# Patient Record
Sex: Male | Born: 1980 | Race: White | Hispanic: No | Smoking: Current every day smoker
Health system: Southern US, Community
[De-identification: ages and names within clinical notes are randomized; demographics above are authoritative.]

## PROBLEM LIST (undated history)

## (undated) DIAGNOSIS — K219 Gastro-esophageal reflux disease without esophagitis: Secondary | ICD-10-CM

## (undated) DIAGNOSIS — M726 Necrotizing fasciitis: Secondary | ICD-10-CM

## (undated) DIAGNOSIS — E119 Type 2 diabetes mellitus without complications: Secondary | ICD-10-CM

## (undated) DIAGNOSIS — I639 Cerebral infarction, unspecified: Secondary | ICD-10-CM

## (undated) DIAGNOSIS — E782 Mixed hyperlipidemia: Secondary | ICD-10-CM

## (undated) HISTORY — PX: ABCESS DRAINAGE: SHX399

## (undated) HISTORY — PX: NO PAST SURGERIES: SHX2092

---

## 2005-11-18 ENCOUNTER — Emergency Department: Payer: Self-pay | Admitting: Emergency Medicine

## 2007-01-11 ENCOUNTER — Emergency Department: Payer: Self-pay

## 2007-01-11 ENCOUNTER — Other Ambulatory Visit: Payer: Self-pay

## 2011-07-06 ENCOUNTER — Emergency Department: Payer: Self-pay | Admitting: Unknown Physician Specialty

## 2012-04-20 ENCOUNTER — Observation Stay: Payer: Self-pay | Admitting: Internal Medicine

## 2012-04-20 LAB — CBC
HGB: 16.2 g/dL (ref 13.0–18.0)
MCHC: 33.6 g/dL (ref 32.0–36.0)
Platelet: 255 10*3/uL (ref 150–440)
RBC: 5.42 10*6/uL (ref 4.40–5.90)
RDW: 12.9 % (ref 11.5–14.5)

## 2012-04-20 LAB — APTT: Activated PTT: 30.6 secs (ref 23.6–35.9)

## 2012-04-20 LAB — DRUG SCREEN, URINE
Amphetamines, Ur Screen: NEGATIVE (ref ?–1000)
Barbiturates, Ur Screen: NEGATIVE (ref ?–200)
Cannabinoid 50 Ng, Ur ~~LOC~~: NEGATIVE (ref ?–50)
Cocaine Metabolite,Ur ~~LOC~~: NEGATIVE (ref ?–300)
MDMA (Ecstasy)Ur Screen: NEGATIVE (ref ?–500)
Tricyclic, Ur Screen: NEGATIVE (ref ?–1000)

## 2012-04-20 LAB — URINALYSIS, COMPLETE
Bilirubin,UR: NEGATIVE
Ketone: NEGATIVE
Leukocyte Esterase: NEGATIVE
Nitrite: NEGATIVE
Ph: 5 (ref 4.5–8.0)
Protein: NEGATIVE
RBC,UR: <1 /HPF (ref 0–5)
Squamous Epithelial: NONE SEEN
WBC UR: 1 /HPF (ref 0–5)

## 2012-04-20 LAB — PROTIME-INR: INR: 0.9

## 2012-04-20 LAB — COMPREHENSIVE METABOLIC PANEL
Albumin: 3.9 g/dL (ref 3.4–5.0)
Alkaline Phosphatase: 124 U/L (ref 50–136)
BUN: 11 mg/dL (ref 7–18)
Calcium, Total: 8.3 mg/dL — ABNORMAL LOW (ref 8.5–10.1)
Co2: 23 mmol/L (ref 21–32)
Glucose: 128 mg/dL — ABNORMAL HIGH (ref 65–99)
Osmolality: 282 (ref 275–301)
SGOT(AST): 56 U/L — ABNORMAL HIGH (ref 15–37)
Sodium: 141 mmol/L (ref 136–145)
Total Protein: 7.8 g/dL (ref 6.4–8.2)

## 2012-04-20 LAB — CK TOTAL AND CKMB (NOT AT ARMC)
CK, Total: 559 U/L — ABNORMAL HIGH (ref 35–232)
CK-MB: 0.7 ng/mL (ref 0.5–3.6)

## 2012-04-20 LAB — TROPONIN I: Troponin-I: <0.02 ng/mL

## 2012-04-20 LAB — MAGNESIUM: Magnesium: 1.9 mg/dL

## 2012-04-20 LAB — TSH: Thyroid Stimulating Horm: 2.74 u[IU]/mL

## 2012-04-20 LAB — PRO B NATRIURETIC PEPTIDE: B-Type Natriuretic Peptide: 13 pg/mL (ref 0–125)

## 2012-04-21 LAB — BASIC METABOLIC PANEL
Anion Gap: 13 (ref 7–16)
Chloride: 109 mmol/L — ABNORMAL HIGH (ref 98–107)
Co2: 21 mmol/L (ref 21–32)
Creatinine: 1.13 mg/dL (ref 0.60–1.30)
Osmolality: 285 (ref 275–301)
Sodium: 143 mmol/L (ref 136–145)

## 2012-04-21 LAB — CBC WITH DIFFERENTIAL/PLATELET
Basophil %: 1.1 %
Eosinophil #: 0.5 10*3/uL (ref 0.0–0.7)
Eosinophil %: 6.3 %
HCT: 45.8 % (ref 40.0–52.0)
HGB: 15.4 g/dL (ref 13.0–18.0)
Lymphocyte %: 31.5 %
MCH: 29.9 pg (ref 26.0–34.0)
MCHC: 33.6 g/dL (ref 32.0–36.0)
Monocyte %: 8.1 %
Neutrophil #: 4.6 10*3/uL (ref 1.4–6.5)

## 2012-04-21 LAB — LIPID PANEL: Triglycerides: 574 mg/dL — ABNORMAL HIGH (ref 0–200)

## 2012-04-21 LAB — ETHANOL: Ethanol: <3 mg/dL

## 2012-04-21 LAB — CK TOTAL AND CKMB (NOT AT ARMC): CK, Total: 363 U/L — ABNORMAL HIGH (ref 35–232)

## 2013-03-06 ENCOUNTER — Emergency Department: Payer: Self-pay | Admitting: Emergency Medicine

## 2013-03-06 LAB — CBC
HCT: 47.8 % (ref 40.0–52.0)
MCHC: 34.1 g/dL (ref 32.0–36.0)
MCV: 88 fL (ref 80–100)
Platelet: 292 10*3/uL (ref 150–440)
RDW: 13 % (ref 11.5–14.5)
WBC: 14.8 10*3/uL — ABNORMAL HIGH (ref 3.8–10.6)

## 2013-03-06 LAB — LIPASE, BLOOD: Lipase: 93 U/L (ref 73–393)

## 2013-03-07 LAB — COMPREHENSIVE METABOLIC PANEL
Albumin: 4.1 g/dL (ref 3.4–5.0)
Alkaline Phosphatase: 128 U/L (ref 50–136)
Anion Gap: 11 (ref 7–16)
Bilirubin,Total: 0.5 mg/dL (ref 0.2–1.0)
Chloride: 106 mmol/L (ref 98–107)
Co2: 21 mmol/L (ref 21–32)
EGFR (African American): >60
Osmolality: 278 (ref 275–301)
SGOT(AST): 41 U/L — ABNORMAL HIGH (ref 15–37)

## 2013-03-07 LAB — TROPONIN I: Troponin-I: <0.02 ng/mL

## 2013-03-07 LAB — URINALYSIS, COMPLETE
Ketone: NEGATIVE
Leukocyte Esterase: NEGATIVE
Nitrite: NEGATIVE
Ph: 6 (ref 4.5–8.0)
Squamous Epithelial: NONE SEEN
WBC UR: 1 /HPF (ref 0–5)

## 2013-03-07 LAB — CK TOTAL AND CKMB (NOT AT ARMC)
CK, Total: 229 U/L (ref 35–232)
CK-MB: 1.1 ng/mL (ref 0.5–3.6)
CK-MB: 0.9 ng/mL (ref 0.5–3.6)

## 2013-06-27 LAB — CBC
MCHC: 35.4 g/dL (ref 32.0–36.0)
Platelet: 257 10*3/uL (ref 150–440)
RBC: 5.15 10*6/uL (ref 4.40–5.90)
RDW: 12.8 % (ref 11.5–14.5)
WBC: 9.9 10*3/uL (ref 3.8–10.6)

## 2013-06-27 LAB — BASIC METABOLIC PANEL
BUN: 15 mg/dL (ref 7–18)
Calcium, Total: 8.5 mg/dL (ref 8.5–10.1)
Co2: 24 mmol/L (ref 21–32)
EGFR (African American): >60
Osmolality: 279 (ref 275–301)
Potassium: 3.9 mmol/L (ref 3.5–5.1)
Sodium: 138 mmol/L (ref 136–145)

## 2013-06-28 ENCOUNTER — Observation Stay: Payer: Self-pay | Admitting: Internal Medicine

## 2013-06-28 LAB — HEMOGLOBIN A1C: Hemoglobin A1C: 6.1 % (ref 4.2–6.3)

## 2013-06-28 LAB — TROPONIN I: Troponin-I: <0.02 ng/mL

## 2013-06-28 LAB — CK TOTAL AND CKMB (NOT AT ARMC)
CK, Total: 208 U/L (ref 35–232)
CK-MB: 1.2 ng/mL (ref 0.5–3.6)

## 2014-06-12 ENCOUNTER — Emergency Department: Payer: Self-pay | Admitting: Emergency Medicine

## 2014-10-01 ENCOUNTER — Ambulatory Visit: Payer: Self-pay

## 2014-10-01 LAB — DOT URINE DIP
Blood: NEGATIVE
Glucose,UR: NEGATIVE
Protein: NEGATIVE
Specific Gravity: 1.02 (ref 1.000–1.030)
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
## 2015-02-03 NOTE — Discharge Summary (Signed)
PATIENT NAME:  Jeffery Barnes, Valerio MR#:  [id number] DATE OF BIRTH:  January 25, 1981  DATE OF ADMISSION:  06/28/2013 DATE OF DISCHARGE:  06/28/2013  ADMITTING DIAGNOSIS: Chest pain.   DISCHARGE DIAGNOSES:  1.  Chest pain felt to be atypical in nature, seen by cardiology. Negative cardiac enzymes.  2.  Questionable history of coronary artery disease. The patient with cardiac [facility]. He was told he had no blockage, but states that he had some sort of cardiac abnormality.  3.  Abnormal echocardiogram with decrease in ejection fraction and wall motion abnormality. He needs outpatient followup with cardiology.  4.  Gastroesophageal reflux disease.  5.  History of sternal fractures in the past.  6.  Morbid obesity.  7.  Possible sleep apnea. Needs outpatient follow with primary care M.D.  8.  Hyperglycemia with a hemoglobin A1c being 6.1 representing glucose intolerance. He needs diet and exercise.   CONSULTANT: Dr. Gwen PoundsKowalski.   EVALUATIONS: Sodium was 138, potassium 3.9, chloride 107, bicarbonate 24, BUN 15, creatinine 0.99, glucose 150. Troponin less than 0.02. WBC 9.9, hemoglobin 16, platelet count 257. EKG: Normal sinus rhythm, Q waves in inferior leads indicating prior MI. Subsequent troponin remained negative. Hemoglobin A1c 6.1. Echocardiogram of the heart showed ejection fraction of 35% to 40%, mild to moderately decreased global left ventricular systolic function, mild and apical inferior septal in mid and apical inferior wall and basal and mild anterolateral walls are abnormal, mild to moderate mitral valve regurg, mild to moderate tricuspid regurg, mild increase in left ventricular posterior wall.   HOSPITAL COURSE: Please refer to H and P done by the admitting physician. The patient is a 34 year old white male with history of apparently some sort of cardiac abnormality noted on cath. According to him, there was no obstruction. [facility]. He presented with chest pain. He described it as worse with certain movements and it was reproducible in nature. The patient had presented with similar type of presentation in 2013. At that time, he had a negative Myoview. Due to his symptoms and this questionable coronary artery disease, he was admitted to the hospital. Further evaluation with serial cardiac enzymes were done. A cardiology consult was obtained. He had an echocardiogram, which was abnormal and it is unclear if this is a new finding. He needs further outpatient cardiac followup. He needs outpatient sleep study as well. At this time, he is doing okay. He has been cleared by cardiology for discharge.   DISCHARGE MEDICATIONS: Prilosec 20 mg 3 caps daily,  acetaminophen/oxycodone 325/5 mg, 1 tab p.o. q.6 hour p.r.n. and ibuprofen 400 q.6 hours p.r.n.   DIET: Low sodium.   ACTIVITY: As tolerated.   DISCHARGE FOLLOWUP: Follow with Dr. Gwen PoundsKowalski in 1 to 2 weeks.   NOTE: 32 minutes spent on this discharge. ____________________________ Lacie ScottsShreyang H. Allena KatzPatel, MD shp:aw D: 06/29/2013 10:34:49 ET T: 06/29/2013 10:43:43 ET JOB#: [id number]  cc: Gigi Onstad H. Allena KatzPatel, MD, <Dictator> Charise CarwinSHREYANG H Emanuel Dowson MD ELECTRONICALLY SIGNED 07/02/2013 13:01
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
## 2015-02-05 NOTE — Discharge Summary (Signed)
PATIENT NAME:  Jeffery Barnes, Jeffery Barnes MR#:  [id number] DATE OF BIRTH:  01/07/1981  DATE OF ADMISSION:  04/20/2012 DATE OF DISCHARGE:  04/22/2012  ADMISSION DIAGNOSIS: Chest pain.   DISCHARGE DIAGNOSES:   1. Chest pain, presumably musculoskeletal.  2. New onset diabetes.   3. Obesity.  4. Hypertriglyceridemia.   CONSULTS: None.   LABORATORY, DIAGNOSTIC, AND RADIOLOGICAL DATA: 2-D echocardiogram showed normal ejection fraction, no wall motion abnormalities.   MYOVIEW showed no ischemia  Hemoglobin A1c 6.6. Troponins x3 were all negative. Sodium 143, potassium 4.0, chloride 109, bicarb 21, BUN 14, creatinine 1.13, glucose 99. VLDL/LDL could not be calculated due to the triglyceride level of 574.   HOSPITAL COURSE: The patient is a 34 year old male presented with atypical chest pain and syncope. For details, please refer to the history and physical.  1. Chest pain. The patient had tenderness to palpation on the left chest wall. He did undergo a Myoview. He had to be hospitalized for two days due to his weight. It was essentially normal. I feel tht his chest pain was due to musculoskeltal issues as he ws tender to palpation which is not  cardiac chest pain.       Will continue NSAIDs as tolerated.  2. Syncope, likely vasovagal from pain versus hot shower. Cardiac enzymes were normal. Normal telemetry. Echo was normal.  3. New onset diabetes. Diabetes educator was consulted. We did not start him on any medications. We will try diet control for now. Dietary was also consulted and will need to follow-up with PCP.  4. 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 Will start Fish Oil and further work-up and care per primary care physician.  5. Smoking dependence. The patient does not want to quit. The patient was counseled for three minutes.  6. Obesity. Encouraged the patient for weight loss.  7. Tachycardia from dehydration, which improved.   DISCHARGE MEDICATIONS:  1. Ibuprofen 600 daily.  2. Nicotine 10 mg  inhalation every hour p.r.n.  3. Fish Oil 1000 mg b.i.d. for elevated triglyceride level.   DISCHARGE DIET: Low sodium, ADA diet.   DISCHARGE ACTIVITY: As tolerated.   DISCHARGE FOLLOW-UP: We will have Case Management help with discharge planning to follow-up with [facility].   TIME SPENT: 35 minutes.   ____________________________ Janyth ContesSital P. Juliene PinaMody, MD spm:drc D: 04/22/2012 13:33:24 ET T: 04/23/2012 10:15:10 ET JOB#: [id number]  cc: Geramy Lamorte P. Juliene PinaMody, MD, <Dictator> [facility] Marybeth Dandy P Jashley Yellin MD ELECTRONICALLY SIGNED 04/23/2012 13:25

## 2015-02-05 NOTE — H&P (Signed)
PATIENT NAME:  Jeffery Barnes, Jeffery Barnes MR#:  [id number] DATE OF BIRTH:  March 23, 1981  DATE OF ADMISSION:  04/20/2012  REFERRING PHYSICIAN: Dr. Enedina FinnerGoli   PRIMARY PHYSICIAN: None.   PRESENTING COMPLAINT: Chest pain, syncope, and collapse.   HISTORY OF PRESENT ILLNESS: Jeffery Barnes is a 34 year old gentleman with history of obesity and tobacco abuse who presents with reports of developing left side chest pain this morning when he woke up. Later this evening he took a hot shower and had development of left-sided sharp pain on the entire side of his left body down to his knee including his left arm to the tip of his fingers. Reports numbness followed by loss of consciousness. He thinks that he was passed out for over one hour. Reports the pain extended down to his knees. His left chest soreness persists. No prior episodes in the past. Reports that he has been drinking lots of fluids with tea and water. He endorses shortness of breath since his spell. Denies any tongue biting and unsure if he had urinary incontinence but no bowel incontinence. Reports that he has some residual right eye blurriness. Denies any fevers, nausea, or vomiting.   PAST MEDICAL HISTORY:  1. Obesity.  2. Tobacco abuse.   PAST SURGICAL HISTORY: None.   ALLERGIES: None.   MEDICATIONS: None. No medications over-the-counter.   FAMILY HISTORY: Mother with diabetes. Father with heart disease but unknown if he had a heart attack and what age.   SOCIAL HISTORY: [place]. He smokes a pack per day for greater than 10 years. He drinks on the weekends about 12 packs. No drug use. He works in [employer] and to[employer].   REVIEW OF SYSTEMS: CONSTITUTIONAL: No fevers, nausea, or vomiting. EYES: Reports right eye blurry vision. ENT: No epistaxis or discharge. RESPIRATORY: No cough or hemoptysis. CARDIOVASCULAR: As per history of present illness. GI: No nausea, vomiting, diarrhea, abdominal pain, hematemesis. GU: No dysuria or hematuria.  ENDOCRINE: No polyuria or polydipsia. HEME: No bleeding. SKIN: No ulcers. MUSCULOSKELETAL: Reports left knee pain and left arm pain as per history of present illness. NEUROLOGIC: Reports numbness of his left arm. PSYCH: Denies any depression or suicidal ideation.   PHYSICAL EXAMINATION:   VITAL SIGNS: Temperature 97.5, pulse 116, respiratory rate 20, blood pressure 121/50, sating at 94% on room air.   GENERAL: Lying in bed in no apparent distress.   HEENT: Normocephalic, atraumatic. Pupils equal, symmetric, nonicteric. Nares without discharge. Oropharynx without erythema or exudate. Moist mucous membrane.   NECK: Soft and supple. No adenopathy or JVP.   CARDIOVASCULAR: Slightly tachy. No murmurs, rubs, or gallops.   LUNGS: Clear to auscultation bilaterally. No use of accessory muscles or increased respiratory effort.   ABDOMEN: Soft. Positive bowel sounds. No mass appreciated.   EXTREMITIES: No edema. Dorsal pedis pulses intact.   MUSCULOSKELETAL: No joint effusion.   SKIN: No ulcers.   NEUROLOGIC: No dysarthria or aphasia. Symmetrical strength. Nose-to-finger intact. No asterixis. No pronator drift.   PSYCH: He is alert and oriented. The patient is cooperative.   PERTINENT LABS AND STUDIES: Left lower extremity Doppler negative for DVT.   CT of the head without contrast with no acute findings.   Chest x-ray, PA and lateral, cannot rule out possibility of low-grade pulmonary vascular congestion.   CK 559. MB 0.7. Troponin less than 0.02. Glucose 128, BUN 11, creatinine 0.82, sodium 141, potassium 4.3, chloride 107, carbon dioxide 23, calcium 8.3, total bilirubin 0.5, alkaline phosphatase 124, ALT 73, AST 56,  total protein 7.8, WBC 9.9, hemoglobin 16.2, hematocrit 48.2, platelet 255, MCV 89. PTT 30.6. D-dimer 0.24. Magnesium 1.9. INR 0.9. TSH 2.74.   EKG with sinus rate of 96. There is an incomplete right bundle branch block and T wave inversion in aVR and V1. No ST elevation.    ASSESSMENT AND PLAN: Jeffery Barnes is a 34 year old gentleman with history of tobacco abuse and obesity presenting with reports of chest pain, shortness of breath, syncope, and collapse.  1. Syncope and collapse with reports of chest pain prior to syncopal spell. Still with persistent chest pain which is reproducible which may be resultant of the fall and musculoskeletal now. Unsure what the precipitating factor for the initial chest pain is. His only risk factor is his tobacco use and possibly family history. Will continue on tele. Cycle cardiac enzymes. His chest x-ray with question of congestion versus hypoinflation. Will get a BNP level. Also get echocardiogram. Repeat his chest x-ray in the morning. CT head is unrevealing. His left lower extremity Doppler is negative for DVT and his D-dimer is negative. He is mildly hypoxic and that could be from atelectasis and also findings on chest x-ray with question of congestion. Again, work-up as above. Questionable if his syncopal spell is related to vasodilation with his hot shower plus/minus in the setting of pain plus/minus dehydration. Tachycardia could be in the setting of volume depletion. He reports excessive fluid intake but endorses drinking tea and water. His orthostatics seem to be positive by pulse. Continue to get orthostatics. Continue IV fluids for now despite his chest x-ray finding as more than likely he is somewhat dehydrated. Will send A1c, fasting lipid panel risk stratification. His TSH is within normal limits. Will also send a urine drug screen and alcohol level.  2. Tobacco abuse. Nicotine patch.  3. Prophylaxis with aspirin and Lovenox.       TIME SPENT: Approximately 50 minutes on patient care.    ____________________________ Reuel Derby, MD ap:drc D: 04/20/2012 23:03:52 ET T: 04/21/2012 07:06:50 ET JOB#: [id number]  cc: Pearlean Brownie Austan Nicholl, MD, <Dictator> Reuel Derby MD ELECTRONICALLY SIGNED 05/02/2012 0:27

## 2015-10-30 ENCOUNTER — Observation Stay
Admission: EM | Admit: 2015-10-30 | Discharge: 2015-10-31 | Disposition: A | Discharge status: Home / Self Care | Payer: Self-pay | Attending: Internal Medicine | Admitting: Internal Medicine

## 2015-10-30 ENCOUNTER — Encounter: Payer: Self-pay | Admitting: Internal Medicine

## 2015-10-30 ENCOUNTER — Emergency Department: Payer: Self-pay

## 2015-10-30 DIAGNOSIS — R209 Unspecified disturbances of skin sensation: Secondary | ICD-10-CM | POA: Insufficient documentation

## 2015-10-30 DIAGNOSIS — Z7982 Long term (current) use of aspirin: Secondary | ICD-10-CM | POA: Insufficient documentation

## 2015-10-30 DIAGNOSIS — R202 Paresthesia of skin: Secondary | ICD-10-CM

## 2015-10-30 DIAGNOSIS — R079 Chest pain, unspecified: Secondary | ICD-10-CM | POA: Diagnosis present

## 2015-10-30 DIAGNOSIS — Z7984 Long term (current) use of oral hypoglycemic drugs: Secondary | ICD-10-CM | POA: Insufficient documentation

## 2015-10-30 DIAGNOSIS — I071 Rheumatic tricuspid insufficiency: Secondary | ICD-10-CM | POA: Insufficient documentation

## 2015-10-30 DIAGNOSIS — I4510 Unspecified right bundle-branch block: Secondary | ICD-10-CM | POA: Insufficient documentation

## 2015-10-30 DIAGNOSIS — I69354 Hemiplegia and hemiparesis following cerebral infarction affecting left non-dominant side: Secondary | ICD-10-CM | POA: Insufficient documentation

## 2015-10-30 DIAGNOSIS — Z6841 Body Mass Index (BMI) 40.0 and over, adult: Secondary | ICD-10-CM | POA: Insufficient documentation

## 2015-10-30 DIAGNOSIS — Z79899 Other long term (current) drug therapy: Secondary | ICD-10-CM | POA: Insufficient documentation

## 2015-10-30 DIAGNOSIS — F17200 Nicotine dependence, unspecified, uncomplicated: Secondary | ICD-10-CM | POA: Insufficient documentation

## 2015-10-30 DIAGNOSIS — E1165 Type 2 diabetes mellitus with hyperglycemia: Secondary | ICD-10-CM | POA: Insufficient documentation

## 2015-10-30 DIAGNOSIS — R531 Weakness: Secondary | ICD-10-CM

## 2015-10-30 DIAGNOSIS — E118 Type 2 diabetes mellitus with unspecified complications: Secondary | ICD-10-CM

## 2015-10-30 DIAGNOSIS — Z833 Family history of diabetes mellitus: Secondary | ICD-10-CM | POA: Insufficient documentation

## 2015-10-30 DIAGNOSIS — I340 Nonrheumatic mitral (valve) insufficiency: Secondary | ICD-10-CM | POA: Insufficient documentation

## 2015-10-30 DIAGNOSIS — G459 Transient cerebral ischemic attack, unspecified: Principal | ICD-10-CM | POA: Diagnosis present

## 2015-10-30 DIAGNOSIS — E781 Pure hyperglyceridemia: Secondary | ICD-10-CM | POA: Insufficient documentation

## 2015-10-30 LAB — CBC
HEMATOCRIT: 46.2 % (ref 40.0–52.0)
HEMOGLOBIN: 15.6 g/dL (ref 13.0–18.0)
MCH: 29.1 pg (ref 26.0–34.0)
MCHC: 33.8 g/dL (ref 32.0–36.0)
MCV: 86.3 fL (ref 80.0–100.0)
Platelets: 253 10*3/uL (ref 150–440)
RBC: 5.36 MIL/uL (ref 4.40–5.90)
RDW: 13.1 % (ref 11.5–14.5)
WBC: 10.6 10*3/uL (ref 3.8–10.6)

## 2015-10-30 LAB — COMPREHENSIVE METABOLIC PANEL
ALK PHOS: 105 U/L (ref 38–126)
ALT: 56 U/L (ref 17–63)
ANION GAP: 10 (ref 5–15)
AST: 40 U/L (ref 15–41)
Albumin: 4.1 g/dL (ref 3.5–5.0)
BILIRUBIN TOTAL: 1 mg/dL (ref 0.3–1.2)
BUN: 15 mg/dL (ref 6–20)
CALCIUM: 8.9 mg/dL (ref 8.9–10.3)
CO2: 24 mmol/L (ref 22–32)
Chloride: 101 mmol/L (ref 101–111)
Creatinine, Ser: 0.84 mg/dL (ref 0.61–1.24)
Glucose, Bld: 221 mg/dL — ABNORMAL HIGH (ref 65–99)
Potassium: 5.1 mmol/L (ref 3.5–5.1)
Sodium: 135 mmol/L (ref 135–145)
TOTAL PROTEIN: 7.2 g/dL (ref 6.5–8.1)

## 2015-10-30 LAB — TROPONIN I: Troponin I: <0.03 ng/mL (ref <0.031)

## 2015-10-30 MED ORDER — SODIUM CHLORIDE 0.9 % IJ SOLN
3.0000 mL | Freq: Two times a day (BID) | INTRAMUSCULAR | Status: DC
  Administered 2015-10-31 (×2): 3 mL via INTRAVENOUS

## 2015-10-30 MED ORDER — ASPIRIN 81 MG PO CHEW
324.0000 mg | CHEWABLE_TABLET | Freq: Once | ORAL | Status: AC
  Administered 2015-10-31: 324 mg via ORAL
  Filled 2015-10-30: qty 4

## 2015-10-30 MED ORDER — ONDANSETRON HCL 4 MG/2ML IJ SOLN: 4.0000 mg | Freq: Four times a day (QID) | INTRAMUSCULAR | Status: DC | PRN

## 2015-10-30 MED ORDER — MORPHINE SULFATE (PF) 2 MG/ML IV SOLN: 2.0000 mg | INTRAVENOUS | Status: DC | PRN

## 2015-10-30 MED ORDER — ACETAMINOPHEN 325 MG PO TABS
650.0000 mg | ORAL_TABLET | Freq: Four times a day (QID) | ORAL | Status: DC | PRN
  Administered 2015-10-31: 650 mg via ORAL
  Filled 2015-10-30: qty 2

## 2015-10-30 MED ORDER — ONDANSETRON HCL 4 MG PO TABS: 4.0000 mg | ORAL_TABLET | Freq: Four times a day (QID) | ORAL | Status: DC | PRN

## 2015-10-30 MED ORDER — ACETAMINOPHEN 650 MG RE SUPP: 650.0000 mg | Freq: Four times a day (QID) | RECTAL | Status: DC | PRN

## 2015-10-30 MED ORDER — ACETAMINOPHEN 500 MG PO TABS
ORAL_TABLET | ORAL | Status: AC
  Administered 2015-10-30: 1000 mg
  Filled 2015-10-30: qty 2

## 2015-10-30 MED ORDER — OXYCODONE HCL 5 MG PO TABS: 5.0000 mg | ORAL_TABLET | ORAL | Status: DC | PRN

## 2015-10-30 NOTE — ED Notes (Signed)
Pt states that he has frequency when urinating; " in two weeks to be check out for prediabetes". Tingling on right side at times.

## 2015-10-30 NOTE — ED Provider Notes (Signed)
[facility] Emergency Department Provider Note  ____________________________________________  Time seen: 2210  I have reviewed the triage vital signs and the nursing notes.  History by: Patient and his wife  HISTORY  Chief Complaint Chest Pain  paresthesia, left arm and leg Diaphoresis, night sweats    HPI Jeffery Barnes is a 35 y.o. male with noted obesity, sedentary lifestyle, and a 2 pack a day cigarette habit. He presents emergency department reporting he's had some chest discomfort for the past 2-3 days. He has been having episodes of diaphoresis, including at night when he is lying in bed. In addition to the chest discomfort, he reports that this evening he began to have numbness and tingling down left arm and his left leg.   The patient reports he has "undiagnosed diabetes". He has never seen a physician about it. He had a glucometer and checked his sugars. They ran up to 340. He was able to "[place]" medication from his mother, and continues to take metformin and glipizide to treat his "undiagnosed" diabetes.  He reports his sugar was proximally 220 before coming to the emergency department tonight.   Past medical history: Self diagnosed diabetes Obesity Tobacco use   There are no active problems to display for this patient.   No past surgical history on file.  No current outpatient prescriptions on file.  Allergies Review of patient's allergies indicates no known allergies.  No family history on file.  Social History Social History  Substance Use Topics  . Smoking status: Not on file  . Smokeless tobacco: Not on file  . Alcohol Use: Not on file    Review of Systems  Constitutional: Negative for fever/chills. ENT: Negative for congestion. Cardiovascular: Positive for chest pain. Respiratory: Negative for cough. Gastrointestinal: Negative for abdominal pain, vomiting and diarrhea. Genitourinary: Negative for  dysuria. Musculoskeletal: No back pain. Skin: Negative for rash. Neurological: Positive for paresthesia in the left arm and left leg.   10-point ROS otherwise negative.  ____________________________________________   PHYSICAL EXAM:  VITAL SIGNS: ED Triage Vitals  Enc Vitals Group     BP 10/30/15 2121 160/89 mmHg     Pulse Rate 10/30/15 2121 97     Resp 10/30/15 2121 18     Temp 10/30/15 2121 97.8 F (36.6 C)     Temp Source 10/30/15 2121 Oral     SpO2 10/30/15 2121 96 %     Weight 10/30/15 2121 375 lb (170.099 kg)     Height 10/30/15 2121 6' (1.829 m)     Head Cir --      Peak Flow --      Pain Score 10/30/15 2122 8     Pain Loc --      Pain Edu? --      Excl. in GC? --     Constitutional: Alert and oriented. Noted large body habitus. No acute distress. ENT   Head: Normocephalic and atraumatic.   Nose: No congestion/rhinnorhea.       Mouth: No erythema, no swelling   Cardiovascular: Normal rate, regular rhythm, no murmur noted Respiratory:  Normal respiratory effort, no tachypnea.    Breath sounds are clear and equal bilaterally.  Gastrointestinal: Soft, no distention. Nontender Back: No muscle spasm, no tenderness, no CVA tenderness. Musculoskeletal: No deformity noted. Nontender with normal range of motion in all extremities.  No noted edema. Neurologic:  Communicative. Normal appearing spontaneous movement in all 4 extremities, but the patient does have a subtle weakness noted in  the left grip, left arm, and left leg. He has mild decreased sensation on the left compared to the right. Cranial nerves II through XII are intact. Skin:  Skin is warm, dry. No rash noted. Psychiatric: Mood and affect are normal. Speech and behavior are normal.  ____________________________________________    LABS (pertinent positives/negatives)  Labs Reviewed  COMPREHENSIVE METABOLIC PANEL - Abnormal; Notable for the following:    Glucose, Bld 221 (*)    All other components  within normal limits  CBC  TROPONIN I   ____________________________________________   EKG  ED ECG REPORT I, Cem Kosman W, the attending physician, personally viewed and interpreted this ECG.   Date: 10/30/2015  EKG Time: 2127  Rate: 95  Rhythm: Normal sinus rhythm  Axis: Normal  Intervals: Normal  ST&T Change: Q waves in lead 3 and aVF   ____________________________________________    RADIOLOGY  Chest x-ray: FINDINGS: The lungs are well-aerated. Mild vascular congestion is noted. Mild peribronchial thickening is seen. There is no evidence of focal opacification, pleural effusion or pneumothorax.  The heart is normal in size; the mediastinal contour is within normal limits. No acute osseous abnormalities are seen.  IMPRESSION: Mild vascular congestion noted. Mild peribronchial thickening seen.   CT head:  IMPRESSION: Unremarkable noncontrast CT of the head. ____________________________________________   PROCEDURES  ____________________________________________   INITIAL IMPRESSION / ASSESSMENT AND PLAN / ED COURSE  Pertinent labs & imaging results that were available during my care of the patient were reviewed by me and considered in my medical decision making (see chart for details).  35 year old male who appears older than stated age, is obese, uses tobacco, and has diabetes that may be poorly treated. Concern for the paresthesia and subtle weakness on left side. Patient may be having a stroke. We will add a CT head to his evaluation this evening with the plan to admit him to the hospital for further evaluation and cardiac serial enzymes. The patient agrees this plan.  ____________________________________________   FINAL CLINICAL IMPRESSION(S) / ED DIAGNOSES  Final diagnoses:  Paresthesia of left arm  Left-sided weakness  Chest pain, unspecified chest pain type  Type 2 diabetes mellitus with complication, without long-term current use of insulin  (HCC)      Darien Ramusavid W Jazier Mcglamery, MD 10/30/15 2336

## 2015-10-30 NOTE — ED Notes (Signed)
Pt in with co chest pain for 2 days, has had shob and diaphoresis, denies hx of heart disease.

## 2015-10-31 ENCOUNTER — Ambulatory Visit
Admission: RE | Admit: 2015-10-31 | Discharge: 2015-10-31 | Disposition: A | Discharge status: Home / Self Care | Payer: Self-pay | Source: Ambulatory Visit | Attending: Internal Medicine | Admitting: Internal Medicine

## 2015-10-31 ENCOUNTER — Inpatient Hospital Stay: Admission: RE | Admit: 2015-10-31 | Payer: Self-pay | Source: Ambulatory Visit

## 2015-10-31 ENCOUNTER — Encounter: Payer: Self-pay | Admitting: Emergency Medicine

## 2015-10-31 ENCOUNTER — Observation Stay: Payer: MEDICAID

## 2015-10-31 ENCOUNTER — Observation Stay
Admit: 2015-10-31 | Discharge: 2015-10-31 | Disposition: A | Discharge status: Home / Self Care | Payer: Self-pay | Attending: Internal Medicine | Admitting: Internal Medicine

## 2015-10-31 DIAGNOSIS — G459 Transient cerebral ischemic attack, unspecified: Secondary | ICD-10-CM | POA: Diagnosis present

## 2015-10-31 LAB — GLUCOSE, CAPILLARY
GLUCOSE-CAPILLARY: 188 mg/dL — AB (ref 65–99)
GLUCOSE-CAPILLARY: 195 mg/dL — AB (ref 65–99)
Glucose-Capillary: 240 mg/dL — ABNORMAL HIGH (ref 65–99)

## 2015-10-31 LAB — LIPID PANEL
CHOL/HDL RATIO: 8.8 ratio
CHOL/HDL RATIO: 8.8 ratio
Cholesterol: 220 mg/dL — ABNORMAL HIGH (ref 0–200)
Cholesterol: 221 mg/dL — ABNORMAL HIGH (ref 0–200)
HDL: 25 mg/dL — AB (ref >40)
HDL: 25 mg/dL — AB (ref >40)
LDL CALC: UNDETERMINED mg/dL (ref 0–99)
LDL CALC: UNDETERMINED mg/dL (ref 0–99)
TRIGLYCERIDES: 912 mg/dL — AB (ref <150)
Triglycerides: 1015 mg/dL — ABNORMAL HIGH (ref <150)
VLDL: UNDETERMINED mg/dL (ref 0–40)
VLDL: UNDETERMINED mg/dL (ref 0–40)

## 2015-10-31 LAB — HEMOGLOBIN A1C: Hgb A1c MFr Bld: 7.7 % — ABNORMAL HIGH (ref 4.0–6.0)

## 2015-10-31 MED ORDER — LORATADINE 10 MG PO TABS
10.0000 mg | ORAL_TABLET | Freq: Every day | ORAL | Status: DC
  Administered 2015-10-31: 10 mg via ORAL
  Filled 2015-10-31: qty 1

## 2015-10-31 MED ORDER — METFORMIN HCL 850 MG PO TABS
850.0000 mg | ORAL_TABLET | Freq: Two times a day (BID) | ORAL | Status: DC
  Filled 2015-10-31 (×2): qty 1

## 2015-10-31 MED ORDER — FENOFIBRATE 160 MG PO TABS: 160.0000 mg | ORAL_TABLET | Freq: Every day | ORAL | Status: AC

## 2015-10-31 MED ORDER — ASPIRIN 81 MG PO CHEW
81.0000 mg | CHEWABLE_TABLET | Freq: Every day | ORAL | Status: DC
  Administered 2015-10-31: 81 mg via ORAL
  Filled 2015-10-31: qty 1

## 2015-10-31 MED ORDER — ASPIRIN EC 81 MG PO TBEC
81.0000 mg | DELAYED_RELEASE_TABLET | Freq: Every day | ORAL | Status: DC
  Filled 2015-10-31: qty 1

## 2015-10-31 MED ORDER — ATORVASTATIN CALCIUM 40 MG PO TABS: 40.0000 mg | ORAL_TABLET | Freq: Every day | ORAL | Status: AC

## 2015-10-31 MED ORDER — INSULIN ASPART 100 UNIT/ML ~~LOC~~ SOLN
0.0000 [IU] | Freq: Three times a day (TID) | SUBCUTANEOUS | Status: DC
  Administered 2015-10-31: 3 [IU] via SUBCUTANEOUS
  Administered 2015-10-31: 2 [IU] via SUBCUTANEOUS
  Filled 2015-10-31: qty 2
  Filled 2015-10-31: qty 3

## 2015-10-31 MED ORDER — FENOFIBRATE 160 MG PO TABS
160.0000 mg | ORAL_TABLET | Freq: Every day | ORAL | Status: DC
  Administered 2015-10-31: 160 mg via ORAL
  Filled 2015-10-31: qty 1

## 2015-10-31 MED ORDER — ATORVASTATIN CALCIUM 20 MG PO TABS
40.0000 mg | ORAL_TABLET | Freq: Every day | ORAL | Status: DC
Start: 2015-10-31 — End: 2015-10-31
  Administered 2015-10-31: 40 mg via ORAL
  Filled 2015-10-31: qty 2

## 2015-10-31 MED ORDER — ALPRAZOLAM 0.25 MG PO TABS
0.2500 mg | ORAL_TABLET | Freq: Once | ORAL | Status: AC
  Administered 2015-10-31: 0.25 mg via ORAL
  Filled 2015-10-31: qty 1

## 2015-10-31 MED ORDER — PANTOPRAZOLE SODIUM 40 MG PO TBEC: 40.0000 mg | DELAYED_RELEASE_TABLET | Freq: Every day | ORAL | Status: DC

## 2015-10-31 MED ORDER — STROKE: EARLY STAGES OF RECOVERY BOOK
Freq: Once | Status: AC
  Administered 2015-10-31: 02:00:00

## 2015-10-31 MED ORDER — PANTOPRAZOLE SODIUM 40 MG PO TBEC
40.0000 mg | DELAYED_RELEASE_TABLET | Freq: Every day | ORAL | Status: DC
  Administered 2015-10-31: 40 mg via ORAL
  Filled 2015-10-31: qty 1

## 2015-10-31 MED ORDER — NICOTINE 21 MG/24HR TD PT24
21.0000 mg | MEDICATED_PATCH | Freq: Every day | TRANSDERMAL | Status: DC
  Administered 2015-10-31: 21 mg via TRANSDERMAL
  Filled 2015-10-31: qty 1

## 2015-10-31 MED ORDER — DOCUSATE SODIUM 100 MG PO CAPS
100.0000 mg | ORAL_CAPSULE | Freq: Every day | ORAL | Status: DC
  Administered 2015-10-31: 100 mg via ORAL
  Filled 2015-10-31: qty 1

## 2015-10-31 MED ORDER — INSULIN ASPART 100 UNIT/ML ~~LOC~~ SOLN: 0.0000 [IU] | Freq: Every day | SUBCUTANEOUS | Status: DC

## 2015-10-31 MED ORDER — GLIPIZIDE 5 MG PO TABS
5.0000 mg | ORAL_TABLET | Freq: Two times a day (BID) | ORAL | Status: DC
  Filled 2015-10-31 (×2): qty 1

## 2015-10-31 MED ORDER — GLIPIZIDE 5 MG PO TABS: 5.0000 mg | ORAL_TABLET | Freq: Every day | ORAL | Status: AC

## 2015-10-31 NOTE — Progress Notes (Signed)
*  PRELIMINARY RESULTS* Echocardiogram 2D Echocardiogram has been performed.  Georgann Housekeeper Hege 10/31/2015, 9:39 AM

## 2015-10-31 NOTE — H&P (Signed)
[facility] [place] [facility]   PATIENT NAME: Jeffery Barnes    MR#:  [id number]  DATE OF BIRTH:  05-Sep-1981   DATE OF ADMISSION:  10/30/2015  PRIMARY CARE PHYSICIAN: No PCP Per Patient   REQUESTING/REFERRING PHYSICIAN: Carollee Massed  CHIEF COMPLAINT:   Chief Complaint  Patient presents with  . Chest Pain   left-sided weakness  HISTORY OF PRESENT ILLNESS:  Jeffery Barnes  is a 35 y.o. male with a known history of "self diagnosed diabetes - takes his mother's medications" otherwise no significant medical history who is presenting with acute onset left-sided weakness and paresthesias. He also attests to having chest pain retrosternal in location pressure/tightness in quality 9/10 intensity no worsening or relieving factors. His chest pain has been occurring intermittently for the past 3 days. With his new onset left-sided weakness it affects upper and lower extremity he also has a right frontal headache.  PAST MEDICAL HISTORY:  History reviewed. No pertinent past medical history.  PAST SURGICAL HISTORY:  History reviewed. No pertinent past surgical history.  SOCIAL HISTORY:   Social History  Substance Use Topics  . Smoking status: Current Every Day Smoker  . Smokeless tobacco: Not on file  . Alcohol Use: No    FAMILY HISTORY:   Family History  Problem Relation Age of Onset  . Diabetes Mother     DRUG ALLERGIES:  No Known Allergies  REVIEW OF SYSTEMS:  REVIEW OF SYSTEMS:  CONSTITUTIONAL: Denies fevers, chills, fatigue, weakness.  EYES: Denies blurred vision, double vision, or eye pain.  EARS, NOSE, THROAT: Denies tinnitus, ear pain, hearing loss.  RESPIRATORY: denies cough, shortness of breath, wheezing  CARDIOVASCULAR: Positive chest pain, denies palpitations, edema.  GASTROINTESTINAL: Denies nausea, vomiting, diarrhea, abdominal pain.  GENITOURINARY: Denies dysuria, hematuria.  ENDOCRINE: Denies nocturia or thyroid  problems. HEMATOLOGIC AND LYMPHATIC: Denies easy bruising or bleeding.  SKIN: Denies rash or lesions.  MUSCULOSKELETAL: Denies pain in neck, back, shoulder, knees, hips, or further arthritic symptoms.  NEUROLOGIC: Denies paralysis, positive left-sided paresthesias.  PSYCHIATRIC: Denies anxiety or depressive symptoms. Otherwise full review of systems performed by me is negative.   MEDICATIONS AT HOME:   Prior to Admission medications   Medication Sig Start Date End Date Taking? Authorizing Provider  aspirin EC 81 MG tablet Take 81 mg by mouth at bedtime.   Yes Historical Provider, MD  docusate sodium (COLACE) 100 MG capsule Take 100 mg by mouth at bedtime.   Yes Historical Provider, MD  glipiZIDE (GLUCOTROL) 5 MG tablet Take 5 mg by mouth daily before breakfast.   Yes Historical Provider, MD  ibuprofen (ADVIL,MOTRIN) 200 MG tablet Take 800 mg by mouth every 6 (six) hours as needed.   Yes Historical Provider, MD  loratadine (CLARITIN) 10 MG tablet Take 10 mg by mouth at bedtime.   Yes Historical Provider, MD  metFORMIN (GLUCOPHAGE) 500 MG tablet Take 500 mg by mouth 2 (two) times daily with a meal.   Yes Historical Provider, MD  omeprazole (PRILOSEC) 40 MG capsule Take 40 mg by mouth at bedtime.   Yes Historical Provider, MD      VITAL SIGNS:  Blood pressure 136/73, pulse 91, temperature 97.8 F (36.6 C), temperature source Oral, resp. rate 22, height 6' (1.829 m), weight 375 lb (170.099 kg), SpO2 94 %.  PHYSICAL EXAMINATION:  VITAL SIGNS: Filed Vitals:   10/30/15 2200 10/31/15 0000  BP: 136/59 136/73  Pulse: 94 91  Temp:    Resp: 16 22  GENERAL:34 y.o.male currently in no acute distress.  HEAD: Normocephalic, atraumatic.  EYES: Pupils equal, round, reactive to light. Extraocular muscles intact. No scleral icterus.  MOUTH: Moist mucosal membrane. Dentition intact. No abscess noted.  EAR, NOSE, THROAT: Clear without exudates. No external lesions.  NECK: Supple. No thyromegaly.  No nodules. No JVD.  PULMONARY: Clear to ascultation, without wheeze rails or rhonci. No use of accessory muscles, Good respiratory effort. good air entry bilaterally CHEST: Nontender to palpation.  CARDIOVASCULAR: S1 and S2. Regular rate and rhythm. No murmurs, rubs, or gallops. No edema. Pedal pulses 2+ bilaterally.  GASTROINTESTINAL: Soft, nontender, nondistended. No masses. Positive bowel sounds. No hepatosplenomegaly.  MUSCULOSKELETAL: No swelling, clubbing, or edema. Range of motion full in all extremities.  NEUROLOGIC: Cranial nerves II through XII are intact. No gross focal neurological deficits. Decreased left upper and left lower extremity Sensation . Reflexes intact. Pronator drift within normal limits, strength 5/5 all extremities including proximal/distal flexion and extension SKIN: No ulceration, lesions, rashes, or cyanosis. Skin warm and dry. Turgor intact.  PSYCHIATRIC: Mood, affect within normal limits. The patient is awake, alert and oriented x 3. Insight, judgment intact.    LABORATORY PANEL:   CBC  Recent Labs Lab 10/30/15 2158  WBC 10.6  HGB 15.6  HCT 46.2  PLT 253   ------------------------------------------------------------------------------------------------------------------  Chemistries   Recent Labs Lab 10/30/15 2158  NA 135  K 5.1  CL 101  CO2 24  GLUCOSE 221*  BUN 15  CREATININE 0.84  CALCIUM 8.9  AST 40  ALT 56  ALKPHOS 105  BILITOT 1.0   ------------------------------------------------------------------------------------------------------------------  Cardiac Enzymes  Recent Labs Lab 10/30/15 2158  TROPONINI <0.03   ------------------------------------------------------------------------------------------------------------------  RADIOLOGY:  Dg Chest 2 View  10/30/2015  CLINICAL DATA:  Acute onset of mid to left-sided chest pain. Left arm numbness. Dizziness, diaphoresis and chills. Blurred vision. Initial encounter. EXAM: CHEST   2 VIEW COMPARISON:  Chest radiograph performed 06/27/2013 FINDINGS: The lungs are well-aerated. Mild vascular congestion is noted. Mild peribronchial thickening is seen. There is no evidence of focal opacification, pleural effusion or pneumothorax. The heart is normal in size; the mediastinal contour is within normal limits. No acute osseous abnormalities are seen. IMPRESSION: Mild vascular congestion noted.  Mild peribronchial thickening seen. Electronically Signed   By: Roanna Raider M.D.   On: 10/30/2015 21:53   Ct Head Wo Contrast  10/30/2015  CLINICAL DATA:  Acute onset of left-sided paresthesia and chest pain. Initial encounter. EXAM: CT HEAD WITHOUT CONTRAST TECHNIQUE: Contiguous axial images were obtained from the base of the skull through the vertex without intravenous contrast. COMPARISON:  Chest radiograph performed 04/20/2012 FINDINGS: There is no evidence of acute infarction, mass lesion, or intra- or extra-axial hemorrhage on CT. The posterior fossa, including the cerebellum, brainstem and fourth ventricle, is within normal limits. The third and lateral ventricles, and basal ganglia are unremarkable in appearance. The cerebral hemispheres are symmetric in appearance, with normal gray-white differentiation. No mass effect or midline shift is seen. There is no evidence of fracture; visualized osseous structures are unremarkable in appearance. The visualized portions of the orbits are within normal limits. The paranasal sinuses and mastoid air cells are well-aerated. No significant soft tissue abnormalities are seen. IMPRESSION: Unremarkable noncontrast CT of the head. Electronically Signed   By: Roanna Raider M.D.   On: 10/30/2015 22:56    EKG:   Orders placed or performed during the hospital encounter of 10/30/15  . ED EKG  . ED EKG  .  EKG 12-Lead  . EKG 12-Lead    IMPRESSION AND PLAN:   [name] without significant medical history who is presenting with  left-sided paresthesias as well as chest pain  1. TIA: Aspirin statin therapy, telemetry, neuro checks, MRI, lipid panel, hemoglobin A1c for risk modification, echocardiogram 2. Chest pain, central: Sounds like angina place on telemetry trend cardiac enzymes 3 given his typical complaint of symptoms will order stress testing 3. Type 2 diabetes non-insulin-requiring: He has never been formally diagnosed with diabetes will check hemoglobin A1c hold oral agents at insulin sliding-scale coverage 4. Venous thromboembolism prophylactic: SCDs    All the records are reviewed and case discussed with ED provider. Management plans discussed with the patient, family and they are in agreement.  CODE STATUS: Full  TOTAL TIME TAKING CARE OF THIS PATIENT: 35 minutes.    Tonyetta Berko,  Mardi Mainland.D on 10/31/2015 at 12:32 AM  Between 7am to 6pm - Pager - [phone]  After 6pm: House Pager: - [phone]  [facility]  Office  [phone]  CC: Primary care physician; No PCP Per Patient

## 2015-10-31 NOTE — Discharge Instructions (Signed)
Diet exercise and loose weight!!

## 2015-10-31 NOTE — Consult Note (Signed)
[facility] Cardiology  CARDIOLOGY CONSULT NOTE  Patient ID: Jeffery Barnes MRN: [id number] DOB/AGE: 34-04-1981 34 y.o.  Admit date: 10/30/2015 Referring Physician [facility] Primary Physician  Primary Cardiologist  Reason for Consultation chest pain  HPI: 35 year old gentleman referred for evaluation of chest pain. The patient reports a three-day history of intermittent episodes of waxing and waning substernal chest discomfort. Patient also describes numbness in his left upper and lower extremity. He also complains of current headache. EKG is nondiagnostic. The patient has ruled out for myocardial infarction by CPK isoenzymes and troponin.  Review of systems complete and found to be negative unless listed above     History reviewed. No pertinent past medical history.  History reviewed. No pertinent past surgical history.  Prescriptions prior to admission  Medication Sig Dispense Refill Last Dose  . aspirin EC 81 MG tablet Take 81 mg by mouth at bedtime.   Past Week at Unknown time  . docusate sodium (COLACE) 100 MG capsule Take 100 mg by mouth at bedtime.   Past Week at Unknown time  . glipiZIDE (GLUCOTROL) 5 MG tablet Take 5 mg by mouth daily before breakfast.   10/30/2015 at Unknown time  . ibuprofen (ADVIL,MOTRIN) 200 MG tablet Take 800 mg by mouth every 6 (six) hours as needed.   prn at prn  . loratadine (CLARITIN) 10 MG tablet Take 10 mg by mouth at bedtime.   Past Week at Unknown time  . metFORMIN (GLUCOPHAGE) 500 MG tablet Take 500 mg by mouth 2 (two) times daily with a meal.   10/30/2015 at Unknown time  . omeprazole (PRILOSEC) 40 MG capsule Take 40 mg by mouth at bedtime.   Past Week at Unknown time   Social History   Social History  . Marital Status: Single    Spouse Name: N/A  . Number of Children: N/A  . Years of Education: N/A   Occupational History  . Not on file.   Social History Main Topics  . Smoking status: Current Every Day Smoker  . Smokeless tobacco: Not on file  .  Alcohol Use: No  . Drug Use: Not on file  . Sexual Activity: Not on file   Other Topics Concern  . Not on file   Social History Narrative    Family History  Problem Relation Age of Onset  . Diabetes Mother       Review of systems complete and found to be negative unless listed above      PHYSICAL EXAM  General: Well developed, well nourished, in no acute distress HEENT:  Normocephalic and atramatic Neck:  No JVD.  Lungs: Clear bilaterally to auscultation and percussion. Heart: HRRR . Normal S1 and S2 without gallops or murmurs.  Abdomen: Bowel sounds are positive, abdomen soft and non-tender  Msk:  Back normal, normal gait. Normal strength and tone for age. Extremities: No clubbing, cyanosis or edema.   Neuro: Alert and oriented X 3. Psych:  Good affect, responds appropriately  Labs:   Lab Results  Component Value Date   WBC 10.6 10/30/2015   HGB 15.6 10/30/2015   HCT 46.2 10/30/2015   MCV 86.3 10/30/2015   PLT 253 10/30/2015    Recent Labs Lab 10/30/15 2158  NA 135  K 5.1  CL 101  CO2 24  BUN 15  CREATININE 0.84  CALCIUM 8.9  PROT 7.2  BILITOT 1.0  ALKPHOS 105  ALT 56  AST 40  GLUCOSE 221*   Lab Results  Component Value Date  CKTOTAL 208 06/28/2013   CKMB 1.2 06/28/2013   TROPONINI <0.03 10/30/2015    Lab Results  Component Value Date   CHOL 221* 10/31/2015   CHOL 220* 10/31/2015   CHOL 174 04/21/2012   Lab Results  Component Value Date   HDL 25* 10/31/2015   HDL 25* 10/31/2015   HDL 18* 04/21/2012   Lab Results  Component Value Date   LDLCALC UNABLE TO CALCULATE IF TRIGLYCERIDE OVER 400 mg/dL 16/07/9603   LDLCALC UNABLE TO CALCULATE IF TRIGLYCERIDE OVER 400 mg/dL 54/06/8118   LDLCALC SEE COMMENT 04/21/2012   Lab Results  Component Value Date   TRIG 1015* 10/31/2015   TRIG 912* 10/31/2015   TRIG 574* 04/21/2012   Lab Results  Component Value Date   CHOLHDL 8.8 10/31/2015   CHOLHDL 8.8 10/31/2015   No results found for:  LDLDIRECT    Radiology: Dg Chest 2 View  10/30/2015  CLINICAL DATA:  Acute onset of mid to left-sided chest pain. Left arm numbness. Dizziness, diaphoresis and chills. Blurred vision. Initial encounter. EXAM: CHEST  2 VIEW COMPARISON:  Chest radiograph performed 06/27/2013 FINDINGS: The lungs are well-aerated. Mild vascular congestion is noted. Mild peribronchial thickening is seen. There is no evidence of focal opacification, pleural effusion or pneumothorax. The heart is normal in size; the mediastinal contour is within normal limits. No acute osseous abnormalities are seen. IMPRESSION: Mild vascular congestion noted.  Mild peribronchial thickening seen. Electronically Signed   By: Roanna Raider M.D.   On: 10/30/2015 21:53   Ct Head Wo Contrast  10/30/2015  CLINICAL DATA:  Acute onset of left-sided paresthesia and chest pain. Initial encounter. EXAM: CT HEAD WITHOUT CONTRAST TECHNIQUE: Contiguous axial images were obtained from the base of the skull through the vertex without intravenous contrast. COMPARISON:  Chest radiograph performed 04/20/2012 FINDINGS: There is no evidence of acute infarction, mass lesion, or intra- or extra-axial hemorrhage on CT. The posterior fossa, including the cerebellum, brainstem and fourth ventricle, is within normal limits. The third and lateral ventricles, and basal ganglia are unremarkable in appearance. The cerebral hemispheres are symmetric in appearance, with normal gray-white differentiation. No mass effect or midline shift is seen. There is no evidence of fracture; visualized osseous structures are unremarkable in appearance. The visualized portions of the orbits are within normal limits. The paranasal sinuses and mastoid air cells are well-aerated. No significant soft tissue abnormalities are seen. IMPRESSION: Unremarkable noncontrast CT of the head. Electronically Signed   By: Roanna Raider M.D.   On: 10/30/2015 22:56    EKG: Normal sinus rhythm  ASSESSMENT AND  PLAN:   1. Chest pain, with atypical features, nondiagnostic ECG, negative troponin  Recommendations  1. Agree with overall current therapy 2. Defer full dose anticoagulation 3. Proceed with ETT as planned  Signed: Kashif Pooler MD,PhD, [facility] 10/31/2015, 12:24 PM

## 2015-10-31 NOTE — Progress Notes (Signed)
Per dr. Allena Katz, cancel cardiology consult. Pt will likely d/c if stress test is negative.

## 2015-10-31 NOTE — Progress Notes (Signed)
Pt. Discharged to home via self (declined wheelchair). Discharge instructions and medication regimen reviewed at bedside with patient. Pt. verbalizes understanding of instructions and medication regimen. Prescriptions sent with patient to fill at pharmacy. Patient assessment unchanged from this morning. TELE and IV discontinued per policy.

## 2015-10-31 NOTE — Progress Notes (Signed)
Patient continues to complain of numbness, tingling and decreased sensation to L upper and lower extremities; pt also describes pain and redness/purpling to L lower leg at times. Upon assessment, LLE does not appear discolored or to be warmer to the touch that RLE. Dr. Allena Katz paged and updated.

## 2015-10-31 NOTE — Discharge Summary (Signed)
[facility] Physicians - [place] [facility]   PATIENT NAME: Jeffery Barnes    MR#:  [id number]  DATE OF BIRTH:  1981/01/13  DATE OF ADMISSION:  10/30/2015 ADMITTING PHYSICIAN: Wyatt Haste, MD  DATE OF DISCHARGE: 10/31/15  PRIMARY CARE PHYSICIAN: No PCP Per Patient    ADMISSION DIAGNOSIS:  TIA (transient ischemic attack) [G45.9] Paresthesia of left arm [R20.2] Left-sided weakness [M62.89] Chest pain, unspecified chest pain type [R07.9] Type 2 diabetes mellitus with complication, without long-term current use of insulin (HCC) [E11.8]  DISCHARGE DIAGNOSIS:  Chest pain ruled out MI with normal stress test Left upper and lower extremity numbness etiology unclear.  Hypertriglyceridemia Type 2 diabetes uncontrolled Morbid obesity  SECONDARY DIAGNOSIS:  History reviewed. No pertinent past medical history.  HOSPITAL COURSE:  [name] without significant medical history who is presenting with left-sided paresthesias as well as chest pain  1. left upper and lower extremity paresthesia suspected TIA: - Aspirin statin therapy,  -Sinus rhythm on telemetry, neuro checks negative other than mild left upper and lower extremity paresthesia -Unable to do MRI due to patient's body size and weight -Severely abnormal lipid panel started on atorvastatin and fenofibrate  2. Chest pain, central: Sounds like angina place on telemetry trend cardiac enzymes 3 given his typical complaint of symptoms will order stress testing -Myoview stress test negative Continue aspirin.  3. Type 2 diabetes non-insulin-requiring: - A1c is 7.7 continue metformin 500 twice a day, glipizide 5 mg twice a day and patient advised to exercise and lose weight   4. Venous thromboembolism prophylactic: SCDs  5. Severe hypertriglyceridemia Now on atorvastatin and fenofibrate  Overall patient is hemodynamically stable. A lot of education and regarding diet exercise and healthy  lifestyle was discussed. Patient  will be following with [facility] on January 20.  We'll discharge him home.    CONSULTS OBTAINED:  Treatment Team:  Wyatt Haste, MD Marcina Millard, MD  DRUG ALLERGIES:  No Known Allergies  DISCHARGE MEDICATIONS:   Current Discharge Medication List    START taking these medications   Details  atorvastatin (LIPITOR) 40 MG tablet Take 1 tablet (40 mg total) by mouth daily at 6 PM. Qty: 30 tablet, Refills: 3    fenofibrate 160 MG tablet Take 1 tablet (160 mg total) by mouth daily. Qty: 30 tablet, Refills: 3      CONTINUE these medications which have CHANGED   Details  glipiZIDE (GLUCOTROL) 5 MG tablet Take 1 tablet (5 mg total) by mouth daily before breakfast. Qty: 60 tablet, Refills: 0      CONTINUE these medications which have NOT CHANGED   Details  aspirin EC 81 MG tablet Take 81 mg by mouth at bedtime.    docusate sodium (COLACE) 100 MG capsule Take 100 mg by mouth at bedtime.    ibuprofen (ADVIL,MOTRIN) 200 MG tablet Take 800 mg by mouth every 6 (six) hours as needed.    loratadine (CLARITIN) 10 MG tablet Take 10 mg by mouth at bedtime.    metFORMIN (GLUCOPHAGE) 500 MG tablet Take 500 mg by mouth 2 (two) times daily with a meal.    omeprazole (PRILOSEC) 40 MG capsule Take 40 mg by mouth at bedtime.        If you experience worsening of your admission symptoms, develop shortness of breath, life threatening emergency, suicidal or homicidal thoughts you must seek medical attention immediately by calling 911 or calling your MD immediately  if symptoms less severe.  You Must read complete instructions/literature along with all the possible adverse reactions/side effects for all the Medicines you take and that have been prescribed to you. Take any new Medicines after you have completely understood and accept all the possible adverse reactions/side effects.   Please note  You were cared for by a hospitalist during  your hospital stay. If you have any questions about your discharge medications or the care you received while you were in the hospital after you are discharged, you can call the unit and asked to speak with the hospitalist on call if the hospitalist that took care of you is not available. Once you are discharged, your primary care physician will handle any further medical issues. Please note that NO REFILLS for any discharge medications will be authorized once you are discharged, as it is imperative that you return to your primary care physician (or establish a relationship with a primary care physician if you do not have one) for your aftercare needs so that they can reassess your need for medications and monitor your lab values. Today   SUBJECTIVE   Left upper and lower extremity numbness and mild  VITAL SIGNS:  Blood pressure 122/72, pulse 98, temperature 97.6 F (36.4 C), temperature source Oral, resp. rate 18, height 6' (1.829 m), weight 160.891 kg (354 lb 11.2 oz), SpO2 99 %.  I/O:  No intake or output data in the 24 hours ending 10/31/15 1425  PHYSICAL EXAMINATION:  GENERAL:  35 y.o.-year-old patient lying in the bed with no acute distress. Morbidly obese EYES: Pupils equal, round, reactive to light and accommodation. No scleral icterus. Extraocular muscles intact.  HEENT: Head atraumatic, normocephalic. Oropharynx and nasopharynx clear.  NECK:  Supple, no jugular venous distention. No thyroid enlargement, no tenderness.  LUNGS: Normal breath sounds bilaterally, no wheezing, rales,rhonchi or crepitation. No use of accessory muscles of respiration.  CARDIOVASCULAR: S1, S2 normal. No murmurs, rubs, or gallops.  ABDOMEN: Soft, non-tender, non-distended. Bowel sounds present. No organomegaly or mass.  EXTREMITIES: No pedal edema, cyanosis, or clubbing.  NEUROLOGIC: Cranial nerves II through XII are intact. Muscle strength 5/5 in all extremities. Sensation intact. Gait not checked. No focal  weakness PSYCHIATRIC: The patient is alert and oriented x 3.  SKIN: No obvious rash, lesion, or ulcer.   DATA REVIEW:   CBC   Recent Labs Lab 10/30/15 2158  WBC 10.6  HGB 15.6  HCT 46.2  PLT 253    Chemistries   Recent Labs Lab 10/30/15 2158  NA 135  K 5.1  CL 101  CO2 24  GLUCOSE 221*  BUN 15  CREATININE 0.84  CALCIUM 8.9  AST 40  ALT 56  ALKPHOS 105  BILITOT 1.0    Microbiology Results   No results found for this or any previous visit (from the past 240 hour(s)).  RADIOLOGY:  Dg Chest 2 View  10/30/2015  CLINICAL DATA:  Acute onset of mid to left-sided chest pain. Left arm numbness. Dizziness, diaphoresis and chills. Blurred vision. Initial encounter. EXAM: CHEST  2 VIEW COMPARISON:  Chest radiograph performed 06/27/2013 FINDINGS: The lungs are well-aerated. Mild vascular congestion is noted. Mild peribronchial thickening is seen. There is no evidence of focal opacification, pleural effusion or pneumothorax. The heart is normal in size; the mediastinal contour is within normal limits. No acute osseous abnormalities are seen. IMPRESSION: Mild vascular congestion noted.  Mild peribronchial thickening seen. Electronically Signed   By: Roanna Raider M.D.   On: 10/30/2015 21:53  Ct Head Wo Contrast  10/30/2015  CLINICAL DATA:  Acute onset of left-sided paresthesia and chest pain. Initial encounter. EXAM: CT HEAD WITHOUT CONTRAST TECHNIQUE: Contiguous axial images were obtained from the base of the skull through the vertex without intravenous contrast. COMPARISON:  Chest radiograph performed 04/20/2012 FINDINGS: There is no evidence of acute infarction, mass lesion, or intra- or extra-axial hemorrhage on CT. The posterior fossa, including the cerebellum, brainstem and fourth ventricle, is within normal limits. The third and lateral ventricles, and basal ganglia are unremarkable in appearance. The cerebral hemispheres are symmetric in appearance, with normal gray-white  differentiation. No mass effect or midline shift is seen. There is no evidence of fracture; visualized osseous structures are unremarkable in appearance. The visualized portions of the orbits are within normal limits. The paranasal sinuses and mastoid air cells are well-aerated. No significant soft tissue abnormalities are seen. IMPRESSION: Unremarkable noncontrast CT of the head. Electronically Signed   By: Roanna Raider M.D.   On: 10/30/2015 22:56     Management plans discussed with the patient, family and they are in agreement.  CODE STATUS:     Code Status Orders        Start     Ordered   10/30/15 2337  Full code   Continuous     10/30/15 2336    Code Status History    Date Active Date Inactive Code Status Order ID Comments User Context   This patient has a current code status but no historical code status.      TOTAL TIME TAKING CARE OF THIS PATIENT: 40 minutes.    Clemencia Helzer M.D on 10/31/2015 at 2:25 PM  Between 7am to 6pm - Pager - [phone] After 6pm go to [email or web address] - password [email or web address]  [facility]  Office  [phone]  CC: Primary care physician; No PCP Per Patient

## 2015-10-31 NOTE — Progress Notes (Addendum)
Inpatient Diabetes Program Recommendations  AACE/ADA: New Consensus Statement on Inpatient Glycemic Control (2015)  Target Ranges:  Prepandial:   less than 140 mg/dL      Peak postprandial:   less than 180 mg/dL (1-2 hours)      Critically ill patients:  140 - 180 mg/dL   Review of Glycemic Control  Results for Jeffery Barnes, Jeffery Barnes (MRN [id number]) as of 10/31/2015 08:12  Ref. Range 10/31/2015 01:02  Glucose-Capillary Latest Ref Range: 65-99 mg/dL 829 (H)    Diabetes history: self diagnosed; A1C 7.7% on 10/31/15 Outpatient Diabetes medications: takes mother's Glipizide /day, Metformin  bid Current orders for Inpatient glycemic control: Novolog 0-9 units tid, Novolog 0-5 units qhs   Inpatient Diabetes Program Recommendations:   If he is going to remain NPO for the day, consider Novolog 0-9 units q4h. If a.m. blood sugars are elevated, consider starting the patient on Lantus insulin16 units day (0.1unit/kg).    If blood sugars remain elevated despite current orders, given his weight, he will likely require the moderate correction Novolog insulin scale.  When he is ordered a diet, chose carb modified/heart healthy.   Susette Racer, RN, BA, MHA, CDE Diabetes Coordinator Inpatient Diabetes Program  [phone] (Team Pager) [phone] [facility] Office) 10/31/2015 8:17 AM

## 2015-10-31 NOTE — Progress Notes (Signed)
Speech Therapy Note: received order, reviewed chart notes and consulted NSG re: pt's status. Met w/ pt who denied any deficits in areas of swallowing and speech-language. Pt conversed at conversational level w/ SLP and seemed confident he had no swallowing deficits - a finished lunch plate was on table. No skilled ST Services indicated at this time. NSG to reconsult if any change in status. Pt agreed.

## 2015-10-31 NOTE — Progress Notes (Signed)
PT Cancellation Note  Patient Details Name: Jeffery Barnes MRN: [id number] DOB: 27-Mar-1981   Cancelled Treatment:    Reason Eval/Treat Not Completed: Medical issues which prohibited therapy (Consult received and chart reviewed.  Patient admitted with chest pain and new onset L-sided weakness; trending troponins, pending cardiology consult and currently NPO for cardiac stress test.  Will hold activity at this time until cleared by cardiology.)   Morning Halberg H. Manson Passey, PT, DPT, NCS 10/31/2015, 10:06 AM [phone]

## 2015-11-03 ENCOUNTER — Observation Stay
Admission: EM | Admit: 2015-11-03 | Discharge: 2015-11-04 | Discharge status: Against Medical Advice | Payer: Self-pay | Attending: Internal Medicine | Admitting: Internal Medicine

## 2015-11-03 ENCOUNTER — Encounter: Payer: Self-pay | Admitting: *Deleted

## 2015-11-03 ENCOUNTER — Emergency Department: Payer: Self-pay

## 2015-11-03 DIAGNOSIS — F17200 Nicotine dependence, unspecified, uncomplicated: Secondary | ICD-10-CM | POA: Insufficient documentation

## 2015-11-03 DIAGNOSIS — Z7982 Long term (current) use of aspirin: Secondary | ICD-10-CM | POA: Insufficient documentation

## 2015-11-03 DIAGNOSIS — R4701 Aphasia: Secondary | ICD-10-CM | POA: Insufficient documentation

## 2015-11-03 DIAGNOSIS — E782 Mixed hyperlipidemia: Secondary | ICD-10-CM | POA: Insufficient documentation

## 2015-11-03 DIAGNOSIS — R51 Headache: Secondary | ICD-10-CM | POA: Insufficient documentation

## 2015-11-03 DIAGNOSIS — R471 Dysarthria and anarthria: Secondary | ICD-10-CM | POA: Insufficient documentation

## 2015-11-03 DIAGNOSIS — Z794 Long term (current) use of insulin: Secondary | ICD-10-CM | POA: Insufficient documentation

## 2015-11-03 DIAGNOSIS — K219 Gastro-esophageal reflux disease without esophagitis: Secondary | ICD-10-CM | POA: Insufficient documentation

## 2015-11-03 DIAGNOSIS — R29810 Facial weakness: Secondary | ICD-10-CM | POA: Insufficient documentation

## 2015-11-03 DIAGNOSIS — E119 Type 2 diabetes mellitus without complications: Secondary | ICD-10-CM | POA: Insufficient documentation

## 2015-11-03 DIAGNOSIS — Z833 Family history of diabetes mellitus: Secondary | ICD-10-CM | POA: Insufficient documentation

## 2015-11-03 DIAGNOSIS — G459 Transient cerebral ischemic attack, unspecified: Principal | ICD-10-CM | POA: Insufficient documentation

## 2015-11-03 DIAGNOSIS — R531 Weakness: Secondary | ICD-10-CM | POA: Insufficient documentation

## 2015-11-03 DIAGNOSIS — R079 Chest pain, unspecified: Secondary | ICD-10-CM | POA: Insufficient documentation

## 2015-11-03 DIAGNOSIS — R130 Aphagia: Secondary | ICD-10-CM

## 2015-11-03 DIAGNOSIS — Z79899 Other long term (current) drug therapy: Secondary | ICD-10-CM | POA: Insufficient documentation

## 2015-11-03 DIAGNOSIS — R4781 Slurred speech: Secondary | ICD-10-CM | POA: Insufficient documentation

## 2015-11-03 HISTORY — DX: Type 2 diabetes mellitus without complications: E11.9

## 2015-11-03 HISTORY — DX: Cerebral infarction, unspecified: I63.9

## 2015-11-03 HISTORY — DX: Gastro-esophageal reflux disease without esophagitis: K21.9

## 2015-11-03 HISTORY — DX: Mixed hyperlipidemia: E78.2

## 2015-11-03 LAB — DIFFERENTIAL
Basophils Absolute: 0.4 10*3/uL — ABNORMAL HIGH (ref 0–0.1)
Basophils Relative: 3 %
Eosinophils Absolute: 0.7 10*3/uL (ref 0–0.7)
Eosinophils Relative: 6 %
LYMPHS PCT: 21 %
Lymphs Abs: 2.5 10*3/uL (ref 1.0–3.6)
MONO ABS: 0.5 10*3/uL (ref 0.2–1.0)
MONOS PCT: 5 %
NEUTROS ABS: 7.7 10*3/uL — AB (ref 1.4–6.5)
Neutrophils Relative %: 65 %

## 2015-11-03 LAB — CBC
HEMATOCRIT: 48.1 % (ref 40.0–52.0)
Hemoglobin: 16.3 g/dL (ref 13.0–18.0)
MCH: 29.2 pg (ref 26.0–34.0)
MCHC: 33.8 g/dL (ref 32.0–36.0)
MCV: 86.5 fL (ref 80.0–100.0)
PLATELETS: 285 10*3/uL (ref 150–440)
RBC: 5.57 MIL/uL (ref 4.40–5.90)
RDW: 12.7 % (ref 11.5–14.5)
WBC: 11.8 10*3/uL — AB (ref 3.8–10.6)

## 2015-11-03 LAB — GLUCOSE, CAPILLARY: Glucose-Capillary: 149 mg/dL — ABNORMAL HIGH (ref 65–99)

## 2015-11-03 NOTE — ED Provider Notes (Signed)
[facility] Emergency Department Provider Note    ____________________________________________  Time seen: 2340  I have reviewed the triage vital signs and the nursing notes.   HISTORY  Chief Complaint Code Stroke   History limited by: aphasia, some history obtained from significant other   HPI Jeffery Barnes is a 35 y.o. male with history of diabetes, hyperlipidemia, and recent hospitalization for TIA (discharged 2 days ago) who presents because of left sided weakness, numbness and difficulty with speech. The symptoms started roughly 3.5 hours ago. They have been constant since they started. The weakness is moderate and the speech is severe. It is accompanied by a left sided headache. On his previous admission his symptoms resolved after 24-36 hours, except for his headache which persisted. He was not able to undergo an MRI.    Past Medical History  Diagnosis Date  . Stroke (HCC)   . Diabetes mellitus without complication [facility])     Patient Active Problem List   Diagnosis Date Noted  . TIA (transient ischemic attack) 10/31/2015  . Chest pain, central 10/30/2015    No past surgical history on file.  Current Outpatient Rx  Name  Route  Sig  Dispense  Refill  . aspirin EC 81 MG tablet   Oral   Take 81 mg by mouth at bedtime.         Marland Kitchen atorvastatin (LIPITOR) 40 MG tablet   Oral   Take 1 tablet (40 mg total) by mouth daily at 6 PM.   30 tablet   3   . docusate sodium (COLACE) 100 MG capsule   Oral   Take 100 mg by mouth at bedtime.         . fenofibrate 160 MG tablet   Oral   Take 1 tablet (160 mg total) by mouth daily.   30 tablet   3   . glipiZIDE (GLUCOTROL) 5 MG tablet   Oral   Take 1 tablet (5 mg total) by mouth daily before breakfast.   60 tablet   0   . ibuprofen (ADVIL,MOTRIN) 200 MG tablet   Oral   Take 800 mg by mouth every 6 (six) hours as needed.         . loratadine (CLARITIN) 10 MG tablet   Oral   Take 10 mg  by mouth at bedtime.         . metFORMIN (GLUCOPHAGE) 500 MG tablet   Oral   Take 500 mg by mouth 2 (two) times daily with a meal.         . omeprazole (PRILOSEC) 40 MG capsule   Oral   Take 40 mg by mouth at bedtime.           Allergies Review of patient's allergies indicates no known allergies.  Family History  Problem Relation Age of Onset  . Diabetes Mother     Social History Social History  Substance Use Topics  . Smoking status: Current Every Day Smoker  . Smokeless tobacco: Not on file  . Alcohol Use: No    Review of Systems  Constitutional: Negative for fever. Cardiovascular: Negative for chest pain. Respiratory: Negative for shortness of breath. Gastrointestinal: Negative for abdominal pain, vomiting and diarrhea. Neurological: Positive for headache, left sided weakness, numbness and speech difficulty.   10-point ROS otherwise negative.  ____________________________________________   PHYSICAL EXAM:  VITAL SIGNS:  98.8 F (37.1 C)  98  --   148/99 mmHg  97 %   Constitutional: Awake  and alert. Aphasic. No acute distress.  Eyes: Conjunctivae are normal. PERRL. Normal extraocular movements. ENT   Head: Normocephalic and atraumatic.   Nose: No congestion/rhinnorhea.   Mouth/Throat: Mucous membranes are moist.   Neck: No stridor. Hematological/Lymphatic/Immunilogical: No cervical lymphadenopathy. Cardiovascular: Normal rate, regular rhythm.  No murmurs, rubs, or gallops. Respiratory: Normal respiratory effort without tachypnea nor retractions. Breath sounds are clear and equal bilaterally. No wheezes/rales/rhonchi. Gastrointestinal: Soft and nontender. No distention.  Genitourinary: Deferred Musculoskeletal: Normal range of motion in all extremities. No joint effusions.  No lower extremity tenderness nor edema. Neurologic:  Expressive aphasia. Grip strength 4/5 in left upper extremity. 5/5 in right upper extremity. Positive left leg  drift that touches the bed. NIHSS of 7 Skin:  Skin is warm, dry and intact. No rash noted. Psychiatric: Mood and affect are normal. Speech and behavior are normal. Patient exhibits appropriate insight and judgment.  ____________________________________________    LABS (pertinent positives/negatives)  Labs Reviewed  CBC - Abnormal; Notable for the following:    WBC 11.8 (*)    All other components within normal limits  DIFFERENTIAL - Abnormal; Notable for the following:    Neutro Abs 7.7 (*)    Basophils Absolute 0.4 (*)    All other components within normal limits  COMPREHENSIVE METABOLIC PANEL - Abnormal; Notable for the following:    Glucose, Bld 141 (*)    All other components within normal limits  GLUCOSE, CAPILLARY - Abnormal; Notable for the following:    Glucose-Capillary 149 (*)    All other components within normal limits  PROTIME-INR  APTT  TROPONIN I  URINE DRUG SCREEN, QUALITATIVE ([facility] ONLY)  CBG MONITORING, ED     ____________________________________________   EKG  I, Phineas Semen, attending physician, personally viewed and interpreted this EKG  EKG Time: 2342 Rate: 94 Rhythm: normal sinus rhythm Axis: normal Intervals: qtc 411 QRS: narrow ST changes: no st elevation Impression: normal ekg  ____________________________________________    RADIOLOGY  CT head  IMPRESSION: Unremarkable noncontrast CT of the head.  I, Phineas Semen, personally discussed these images and results by phone with the on-call radiologist and used this discussion as part of my medical decision making.  ____________________________________________   PROCEDURES  Procedure(s) performed: None  Critical Care performed: Yes, see critical care note(s)  CRITICAL CARE Performed by: Phineas Semen   Total critical care time: 30 minutes  Critical care time was exclusive of separately billable procedures and treating other patients.  Critical care was necessary  to treat or prevent imminent or life-threatening deterioration.  Critical care was time spent personally by me on the following activities: development of treatment plan with patient and/or surrogate as well as nursing, discussions with consultants, evaluation of patient's response to treatment, examination of patient, obtaining history from patient or surrogate, ordering and performing treatments and interventions, ordering and review of laboratory studies, ordering and review of radiographic studies, pulse oximetry and re-evaluation of patient's condition.  ____________________________________________   INITIAL IMPRESSION / ASSESSMENT AND PLAN / ED COURSE  Pertinent labs & imaging results that were available during my care of the patient were reviewed by me and considered in my medical decision making (see chart for details).  Patient presented to the emergency department today because of concerns for a aphasia and the left-sided weakness. On my exam patient was a basic. Patient did have weakness and subjective sensory change to the left upper and lower extremities. A code stroke was called. Head CT did not show any obvious  bleeding. Telemetry neurology was consulted. They did have a discussion with the patient about TPA. The patient declined TPA. Patient will be given aspirin and Plavix here. Plan for admission to the hospitalist service.  ____________________________________________   FINAL CLINICAL IMPRESSION(S) / ED DIAGNOSES  Final diagnoses:  Aphagia  Left-sided weakness     Phineas Semen, MD 11/04/15 [phone]

## 2015-11-03 NOTE — ED Notes (Signed)
CBG of 149 upon arrival

## 2015-11-03 NOTE — ED Notes (Signed)
Pt placed in room 10 at this time, directly from CT

## 2015-11-04 ENCOUNTER — Encounter: Payer: Self-pay | Admitting: Internal Medicine

## 2015-11-04 ENCOUNTER — Emergency Department: Payer: Self-pay

## 2015-11-04 DIAGNOSIS — E119 Type 2 diabetes mellitus without complications: Secondary | ICD-10-CM

## 2015-11-04 DIAGNOSIS — E782 Mixed hyperlipidemia: Secondary | ICD-10-CM | POA: Diagnosis present

## 2015-11-04 DIAGNOSIS — K219 Gastro-esophageal reflux disease without esophagitis: Secondary | ICD-10-CM | POA: Diagnosis present

## 2015-11-04 LAB — COMPREHENSIVE METABOLIC PANEL
ALK PHOS: 105 U/L (ref 38–126)
ALT: 58 U/L (ref 17–63)
ANION GAP: 10 (ref 5–15)
AST: 33 U/L (ref 15–41)
Albumin: 4.5 g/dL (ref 3.5–5.0)
BILIRUBIN TOTAL: 0.7 mg/dL (ref 0.3–1.2)
BUN: 18 mg/dL (ref 6–20)
CALCIUM: 9.8 mg/dL (ref 8.9–10.3)
CO2: 24 mmol/L (ref 22–32)
Chloride: 103 mmol/L (ref 101–111)
Creatinine, Ser: 0.95 mg/dL (ref 0.61–1.24)
GFR calc non Af Amer: >60 mL/min (ref >60)
Glucose, Bld: 141 mg/dL — ABNORMAL HIGH (ref 65–99)
Potassium: 4 mmol/L (ref 3.5–5.1)
Sodium: 137 mmol/L (ref 135–145)
TOTAL PROTEIN: 7.8 g/dL (ref 6.5–8.1)

## 2015-11-04 LAB — TROPONIN I: Troponin I: <0.03 ng/mL (ref <0.031)

## 2015-11-04 LAB — APTT: aPTT: 26 seconds (ref 24–36)

## 2015-11-04 LAB — PROTIME-INR
INR: 0.95
PROTHROMBIN TIME: 12.9 s (ref 11.4–15.0)

## 2015-11-04 MED ORDER — CLOPIDOGREL BISULFATE 75 MG PO TABS
300.0000 mg | ORAL_TABLET | Freq: Once | ORAL | Status: AC
  Administered 2015-11-04: 300 mg via ORAL
  Filled 2015-11-04: qty 4

## 2015-11-04 MED ORDER — NICOTINE POLACRILEX 2 MG MT GUM
2.0000 mg | CHEWING_GUM | OROMUCOSAL | Status: DC | PRN
  Filled 2015-11-04: qty 1

## 2015-11-04 MED ORDER — ASPIRIN 81 MG PO CHEW
324.0000 mg | CHEWABLE_TABLET | Freq: Once | ORAL | Status: AC
  Administered 2015-11-04: 324 mg via ORAL
  Filled 2015-11-04: qty 4

## 2015-11-04 MED ORDER — PROCHLORPERAZINE EDISYLATE 5 MG/ML IJ SOLN
10.0000 mg | Freq: Once | INTRAMUSCULAR | Status: AC
  Administered 2015-11-04: 10 mg via INTRAVENOUS
  Filled 2015-11-04: qty 2

## 2015-11-04 MED ORDER — ALPRAZOLAM 0.5 MG PO TABS
0.5000 mg | ORAL_TABLET | Freq: Three times a day (TID) | ORAL | Status: DC | PRN
  Administered 2015-11-04: 0.5 mg via ORAL
  Filled 2015-11-04: qty 1

## 2015-11-04 NOTE — Progress Notes (Signed)
Called by nursing that patient had left AMA.  Patient had previously mentioned desire to leave, though he agreed to stay once we had arranged to have MRI done at [facility] via [place] transport.  Subsequently he left AMA.  Previously he had verbally stated understanding of his risk of possible stroke leading to permanent deficits or death.  Kristeen Miss [facility] 11/04/2015, 3:29 AM

## 2015-11-04 NOTE — ED Notes (Signed)
Dr. Anne Hahn notified that patient signed out AMA.

## 2015-11-04 NOTE — ED Notes (Signed)
MD Willis at bedside. 

## 2015-11-04 NOTE — ED Notes (Signed)
MD Goodman at bedside. 

## 2015-11-04 NOTE — ED Notes (Addendum)
Pt declined TPA administration at this time. SOC MD aware along with MD Derrill Kay

## 2015-11-04 NOTE — ED Notes (Signed)
SOC MD on at this time  

## 2015-11-04 NOTE — ED Notes (Signed)
Pt arrived to ED via POV with left sided weakness, expressive aphasia, and left sided facial droop that started at approx 2145. Pt recently discharged with TIA's on Tuesday. Pt AAOx4, following commands, speech fragmented and slurred. Pt taken directly to CT upon arrival, code stroke called. MD Derrill Kay made aware. Vitals stable at this time. SOC in process.

## 2015-11-04 NOTE — ED Notes (Addendum)
SOC MD called MD Derrill Kay at this time in regards to pt's status and decline of TPA. SEE MAR for [facility] recommendations.

## 2015-11-04 NOTE — H&P (Signed)
[facility] [place] at [facility]   PATIENT NAME: Jeffery Barnes    MR#:  [id number]  DATE OF BIRTH:  [birth year]  DATE OF ADMISSION:  11/03/2015  PRIMARY CARE PHYSICIAN: No PCP Per Patient   REQUESTING/REFERRING PHYSICIAN: Derrill Kay, MD  CHIEF COMPLAINT:   Chief Complaint  Patient presents with  . Code Stroke    HISTORY OF PRESENT ILLNESS:  Jeffery Barnes  is a 35 y.o. male who presents with recurrent TIA/stroke. Patient was recently admitted here in the hospital and discharged a couple days ago. He presented that time with left upper and lower extremity weakness. He had a workup here for stroke but was unable to get an MRI due to his physical size and having difficulty fitting into our machine here. He was also worked up for cardiac pathology as patient had some chest pain, he had a negative stress test and negative cardiac enzymes. He returns today after having a recurrence of his left upper and lower showing any weakness which also presented with some left facial droop and a little bit of dysarthria. He states that the symptoms always occur first with a headache. These symptoms resolved again in the ED. CT head was again negative. Hospitalists were called for admission for further evaluation and workup on recommendation of the telemetry neurologist.  PAST MEDICAL HISTORY:   Past Medical History  Diagnosis Date  . Stroke (HCC)   . Diabetes mellitus without complication (HCC)   . GERD (gastroesophageal reflux disease)   . Mixed hyperlipidemia     PAST SURGICAL HISTORY:   Past Surgical History  Procedure Laterality Date  . No past surgeries      SOCIAL HISTORY:   Social History  Substance Use Topics  . Smoking status: Current Every Day Smoker  . Smokeless tobacco: Not on file  . Alcohol Use: No    FAMILY HISTORY:   Family History  Problem Relation Age of Onset  . Diabetes Mother     DRUG ALLERGIES:  No Known Allergies  MEDICATIONS AT  HOME:   Prior to Admission medications   Medication Sig Start Date End Date Taking? Authorizing Provider  aspirin EC 81 MG tablet Take 81 mg by mouth at bedtime.    Historical Provider, MD  atorvastatin (LIPITOR) 40 MG tablet Take 1 tablet (40 mg total) by mouth daily at 6 PM. 10/31/15   Enedina Finner, MD  docusate sodium (COLACE) 100 MG capsule Take 100 mg by mouth at bedtime.    Historical Provider, MD  fenofibrate 160 MG tablet Take 1 tablet (160 mg total) by mouth daily. 10/31/15   Enedina Finner, MD  glipiZIDE (GLUCOTROL) 5 MG tablet Take 1 tablet (5 mg total) by mouth daily before breakfast. 10/31/15   Enedina Finner, MD  ibuprofen (ADVIL,MOTRIN) 200 MG tablet Take 800 mg by mouth every 6 (six) hours as needed.    Historical Provider, MD  loratadine (CLARITIN) 10 MG tablet Take 10 mg by mouth at bedtime.    Historical Provider, MD  metFORMIN (GLUCOPHAGE) 500 MG tablet Take 500 mg by mouth 2 (two) times daily with a meal.    Historical Provider, MD  omeprazole (PRILOSEC) 40 MG capsule Take 40 mg by mouth at bedtime.    Historical Provider, MD    REVIEW OF SYSTEMS:  Review of Systems  Constitutional: Negative for fever, chills, weight loss and malaise/fatigue.  HENT: Negative for ear pain, hearing loss and tinnitus.   Eyes: Negative for blurred vision, double  vision, pain and redness.  Respiratory: Negative for cough, hemoptysis and shortness of breath.   Cardiovascular: Negative for chest pain, palpitations, orthopnea and leg swelling.  Gastrointestinal: Negative for nausea, vomiting, abdominal pain, diarrhea and constipation.  Genitourinary: Negative for dysuria, frequency and hematuria.  Musculoskeletal: Negative for back pain, joint pain and neck pain.  Skin:       No acne, rash, or lesions  Neurological: Positive for speech change and focal weakness. Negative for dizziness, tremors and weakness.  Endo/Heme/Allergies: Negative for polydipsia. Does not bruise/bleed easily.   Psychiatric/Behavioral: Negative for depression. The patient is not nervous/anxious and does not have insomnia.      VITAL SIGNS:   Filed Vitals:   11/03/15 2352 11/04/15 0014 11/04/15 0115  BP: 148/99 141/78 138/67  Pulse: 98 99 101  Temp: 98.8 F (37.1 C)    TempSrc: Oral    Resp:  22 16  Height: 6' (1.829 m)    Weight: 161.889 kg (356 lb 14.4 oz)    SpO2: 97% 94% 93%   Wt Readings from Last 3 Encounters:  11/03/15 161.889 kg (356 lb 14.4 oz)  10/31/15 160.891 kg (354 lb 11.2 oz)    PHYSICAL EXAMINATION:  Physical Exam  Vitals reviewed. Constitutional: He is oriented to person, place, and time. He appears well-developed and well-nourished. No distress.  HENT:  Head: Normocephalic and atraumatic.  Mouth/Throat: Oropharynx is clear and moist.  Eyes: Conjunctivae and EOM are normal. Pupils are equal, round, and reactive to light. No scleral icterus.  Neck: Normal range of motion. Neck supple. No JVD present. No thyromegaly present.  Cardiovascular: Normal rate, regular rhythm and intact distal pulses.  Exam reveals no gallop and no friction rub.   No murmur heard. Respiratory: Effort normal and breath sounds normal. No respiratory distress. He has no wheezes. He has no rales.  GI: Soft. Bowel sounds are normal. He exhibits no distension. There is no tenderness.  Musculoskeletal: Normal range of motion. He exhibits no edema.  No arthritis, no gout  Lymphadenopathy:    He has no cervical adenopathy.  Neurological: He is alert and oriented to person, place, and time. No cranial nerve deficit.  Neurologic: Cranial nerves II-XII intact, Sensation intact to light touch/pinprick, 5/5 strength in all extremities, no dysarthria, no aphasia, no dysphagia, memory intact, finger to nose testing showed no abnormality, no pronator drift, DTR intact, Babinski sign not present.   Skin: Skin is warm and dry. No rash noted. No erythema.  Psychiatric: He has a normal mood and affect. His  behavior is normal. Judgment and thought content normal.    LABORATORY PANEL:   CBC  Recent Labs Lab 11/03/15 2346  WBC 11.8*  HGB 16.3  HCT 48.1  PLT 285   ------------------------------------------------------------------------------------------------------------------  Chemistries   Recent Labs Lab 11/03/15 2346  NA 137  K 4.0  CL 103  CO2 24  GLUCOSE 141*  BUN 18  CREATININE 0.95  CALCIUM 9.8  AST 33  ALT 58  ALKPHOS 105  BILITOT 0.7   ------------------------------------------------------------------------------------------------------------------  Cardiac Enzymes  Recent Labs Lab 11/03/15 2346  TROPONINI <0.03   ------------------------------------------------------------------------------------------------------------------  RADIOLOGY:  Ct Head Wo Contrast  11/03/2015  CLINICAL DATA:  Acute onset of left-sided weakness and slurred speech. Code stroke. Initial encounter. EXAM: CT HEAD WITHOUT CONTRAST TECHNIQUE: Contiguous axial images were obtained from the base of the skull through the vertex without intravenous contrast. COMPARISON:  CT of the head performed 10/30/2015 FINDINGS: There is no evidence of acute  infarction, mass lesion, or intra- or extra-axial hemorrhage on CT. The posterior fossa, including the cerebellum, brainstem and fourth ventricle, is within normal limits. The third and lateral ventricles, and basal ganglia are unremarkable in appearance. The cerebral hemispheres are symmetric in appearance, with normal gray-white differentiation. No mass effect or midline shift is seen. There is no evidence of fracture; visualized osseous structures are unremarkable in appearance. The visualized portions of the orbits are within normal limits. The paranasal sinuses and mastoid air cells are well-aerated. No significant soft tissue abnormalities are seen. IMPRESSION: Unremarkable noncontrast CT of the head. These results were called by telephone at the  time of interpretation on 11/03/2015 at 11:47 pm to Dr. Phineas Semen, who verbally acknowledged these results. Electronically Signed   By: Roanna Raider M.D.   On: 11/03/2015 23:47    EKG:   Orders placed or performed during the hospital encounter of 11/03/15  . ED EKG  . ED EKG    IMPRESSION AND PLAN:  Principal Problem:   TIA (transient ischemic attack) - left hemi-paresis with left facial droop and some dysarthria. Symptoms have completely resolved at this time. Patient was unable to get MRI during his last hospital stay for the same. CT/CTA head and neck. However, the patient was only able to get CT head as he also was unable to fit far enough into the CT scanner for CTA neck. We will have a neurologist see him and explore further options for possible MRI scanning. Unclear if this patient is having stuttering TIA/stroke versus atypical migraine. He denies any typical migraine symptoms except for unilateral headache associated with his symptoms. Active Problems:   Type 2 diabetes mellitus (HCC) - sliding scale insulin with glucose checks before meals at bedtime and heart healthy/carb modified diet   GERD (gastroesophageal reflux disease) - home dose PPI   Mixed hyperlipidemia - home dose anti-lipids  All the records are reviewed and case discussed with ED provider. Management plans discussed with the patient and/or family.  DVT PROPHYLAXIS: SubQ lovenox  GI PROPHYLAXIS: PPI  ADMISSION STATUS: Observation  CODE STATUS: Full Code Status History    Date Active Date Inactive Code Status Order ID Comments User Context   10/30/2015 11:36 PM 10/31/2015  6:16 PM Full Code [id number]  Wyatt Haste, MD ED      TOTAL TIME TAKING CARE OF THIS PATIENT: 40 minutes.    Lofton Leon FIELDING 11/04/2015, 1:29 AM  [facility]  Office  [phone]  CC: Primary care physician; No PCP Per Patient

## 2015-11-04 NOTE — ED Notes (Addendum)
Pt now states at this time symptoms started at 2145. Pt states he was "unloading his truck at 8, but that is not when the symptoms started" Per pt's significant other, pt called her at 2145 to express that symptoms started at that time. MD SOC made aware at this time. SOC still in progress currently

## 2015-11-04 NOTE — ED Notes (Signed)
Pt ambulatory to bathroom at this time with no concerns. Pt tolerated well. No acute distress noted.

## 2015-11-04 NOTE — ED Notes (Signed)
Patient standing at doorway and stating that he is leaving and going home and if there is something for him to sign we better get it before he walks out. Charge nurse notified.  This RN and the charge nurse talked with patient and encouraged him to stay but patient refuses.

## 2016-05-06 ENCOUNTER — Ambulatory Visit
Admission: EM | Admit: 2016-05-06 | Discharge: 2016-05-06 | Disposition: A | Discharge status: Home / Self Care | Payer: Self-pay | Attending: Family Medicine | Admitting: Family Medicine

## 2016-05-06 ENCOUNTER — Encounter: Payer: Self-pay | Admitting: *Deleted

## 2016-05-06 DIAGNOSIS — L0390 Cellulitis, unspecified: Secondary | ICD-10-CM

## 2016-05-06 DIAGNOSIS — A419 Sepsis, unspecified organism: Secondary | ICD-10-CM

## 2016-05-06 DIAGNOSIS — E119 Type 2 diabetes mellitus without complications: Secondary | ICD-10-CM

## 2016-05-06 DIAGNOSIS — L0291 Cutaneous abscess, unspecified: Secondary | ICD-10-CM

## 2016-05-06 LAB — GLUCOSE, CAPILLARY: Glucose-Capillary: 404 mg/dL — ABNORMAL HIGH (ref 65–99)
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
## 2016-05-06 NOTE — ED Triage Notes (Signed)
Patient started having symptoms of abscess pain on his left buttock 2 days ago.Pain became worse today with nausea, vomiting, and fever.

## 2016-05-06 NOTE — ED Notes (Signed)
Colon Flattery, PA notified of CBG of 404

## 2016-05-06 NOTE — ED Notes (Signed)
Called care coordinator for [facility], spoke to [employer], communicated transport to [facility] by POV. Report given to Chief of Staff at [facility].

## 2016-05-26 IMAGING — CT CT HEAD W/O CM
1 series · 16 of 30 positions shown, 20 images · non-contrast
Comparison: Chest radiograph performed 04/20/2012

CLINICAL DATA: Acute onset of left-sided paresthesia and chest
pain. Initial encounter.

EXAM:
CT HEAD WITHOUT CONTRAST
TECHNIQUE: Contiguous axial images were obtained from the base of the skull
through the vertex without intravenous contrast.

[Series 2: head wo · axial · 0.49mm/px · z∈[-176,-50]mm · 16 of 32 slices shown, 20 images]
[im 2/32  brain]
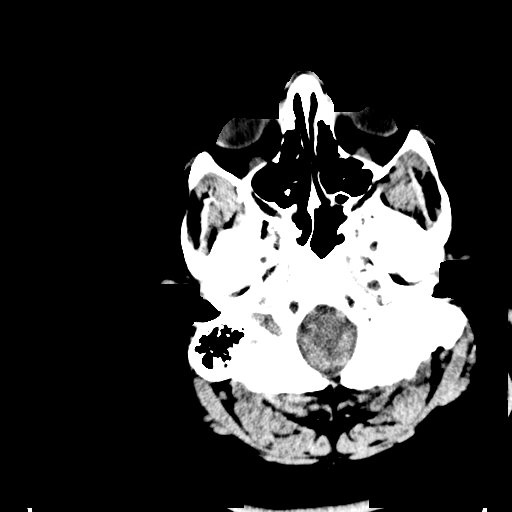
[im 2/32  bone]
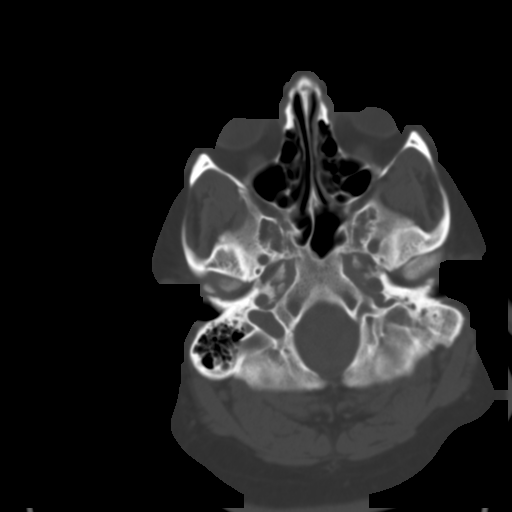
[im 4/32  brain]
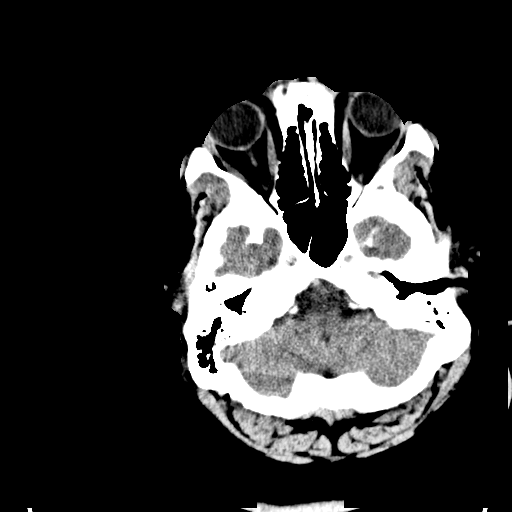
[im 6/32  brain]
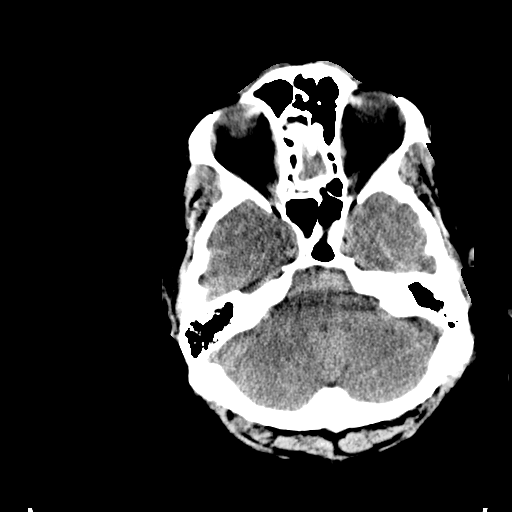
[im 8/32  brain]
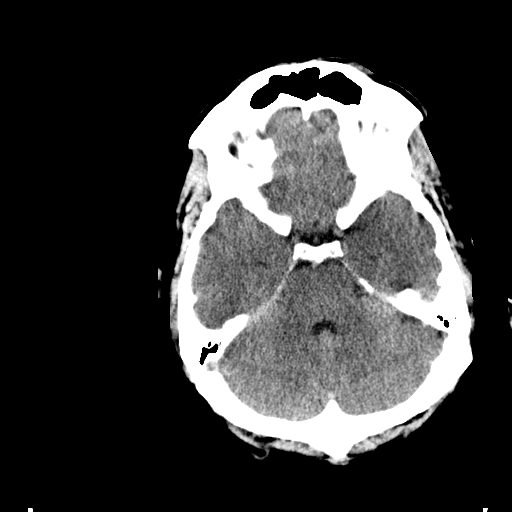
[im 9/32  brain]
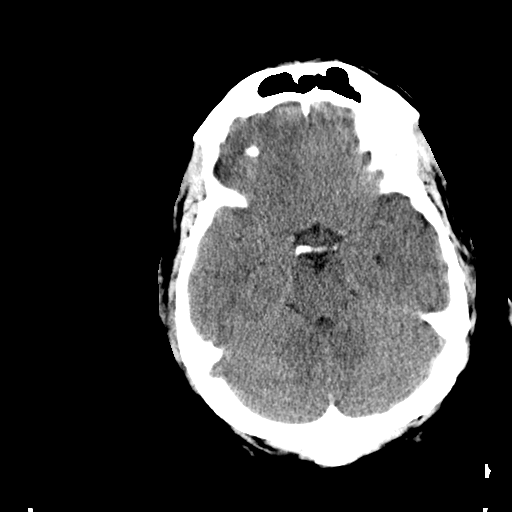
[im 9/32  bone]
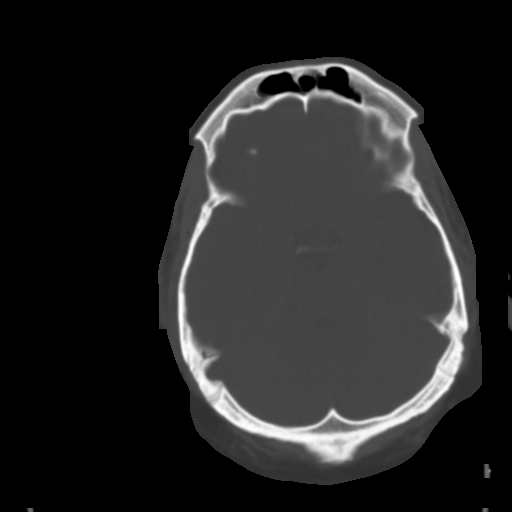
[im 11/32  brain]
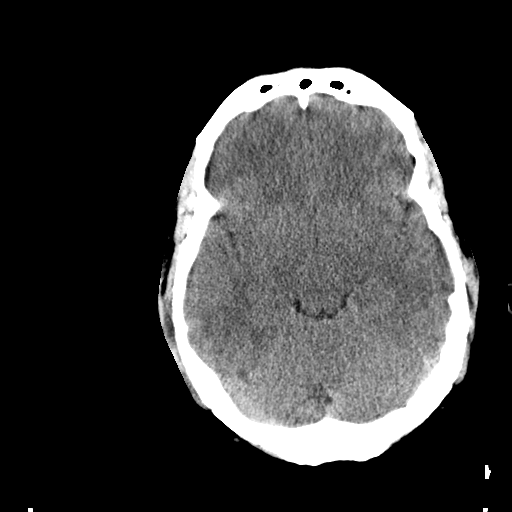
[im 13/32  brain]
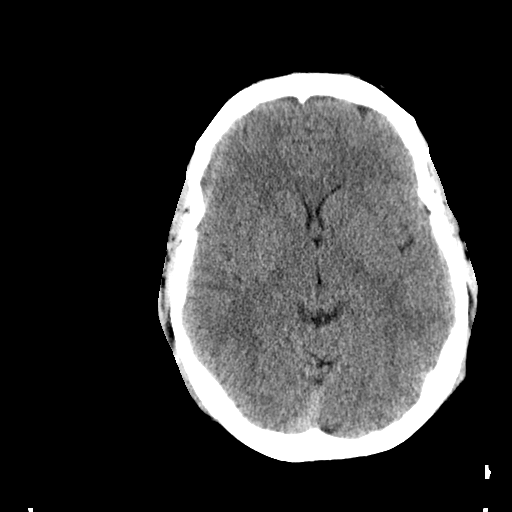
[im 15/32  brain]
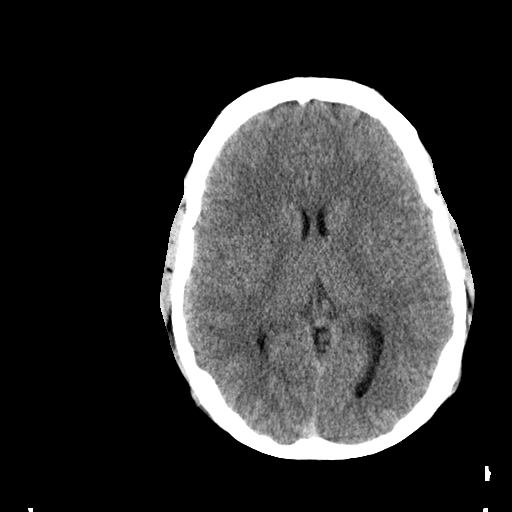
[im 17/32  brain]
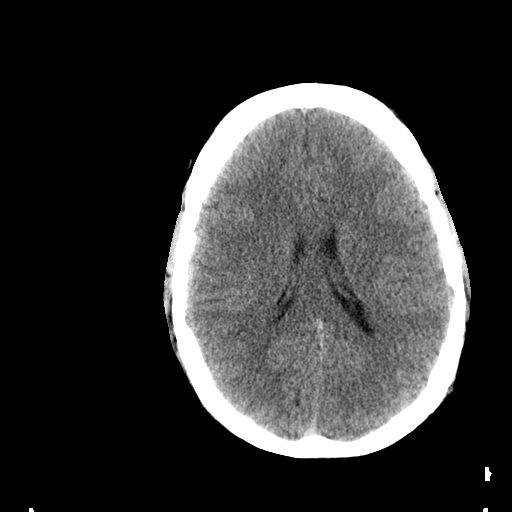
[im 17/32  bone]
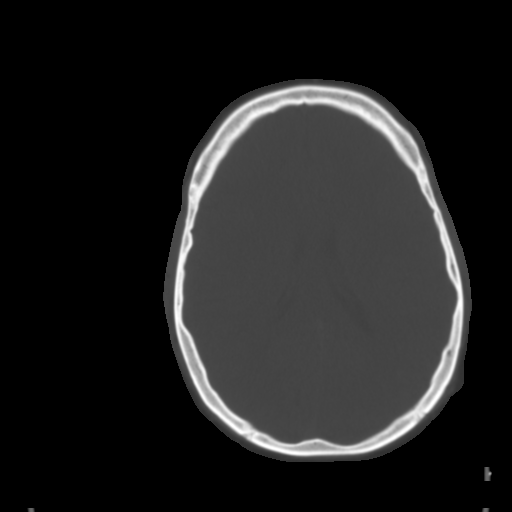
[im 19/32  brain]
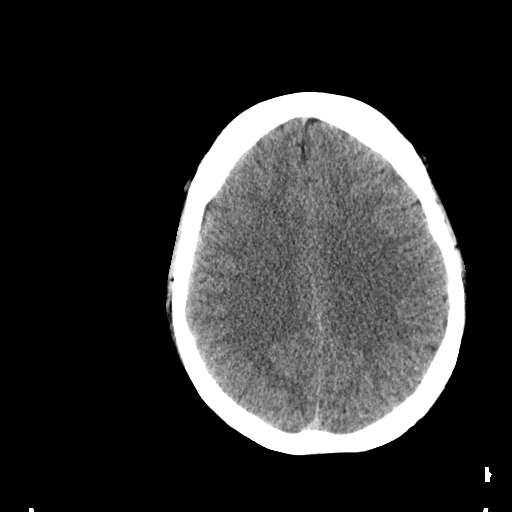
[im 21/32  brain]
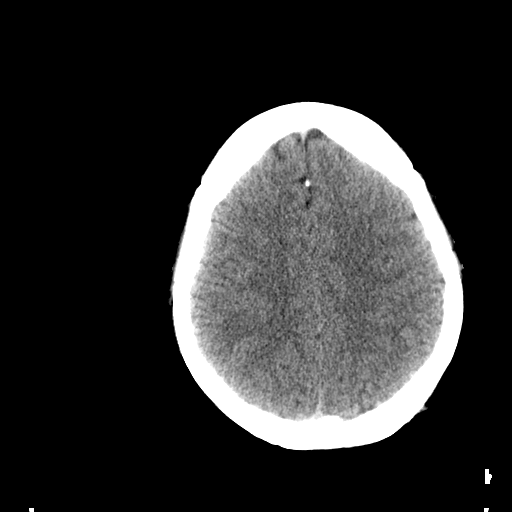
[im 23/32  brain]
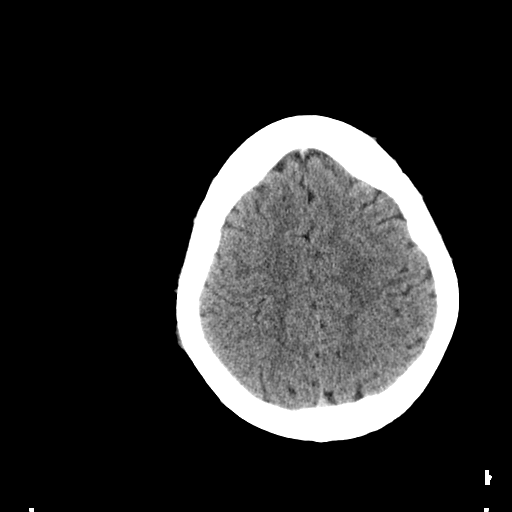
[im 24/32  brain]
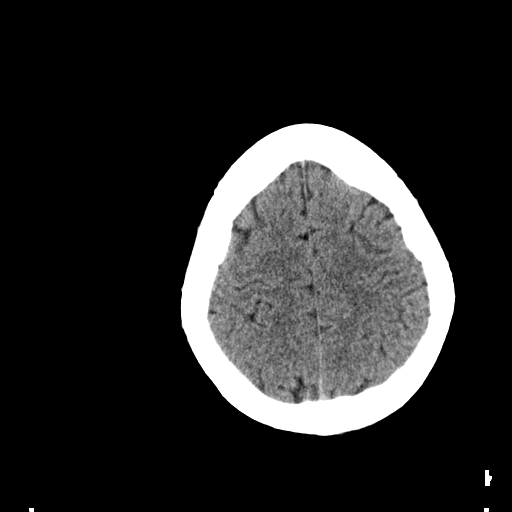
[im 24/32  bone]
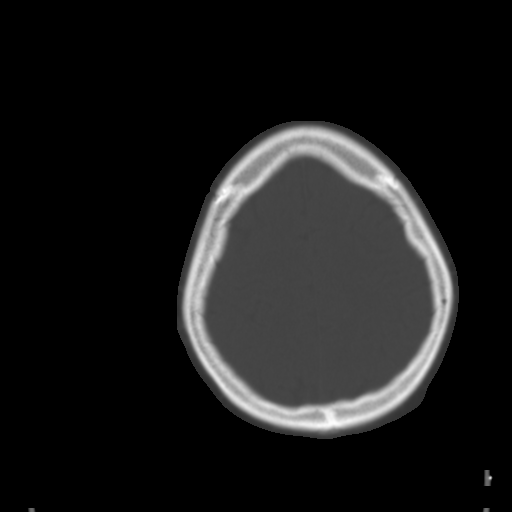
[im 26/32  brain]
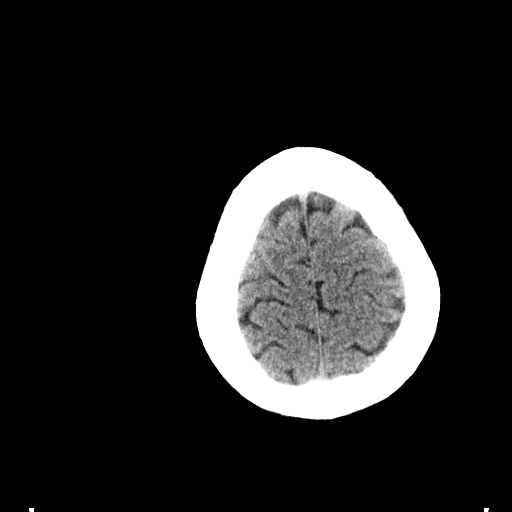
[im 28/32  brain]
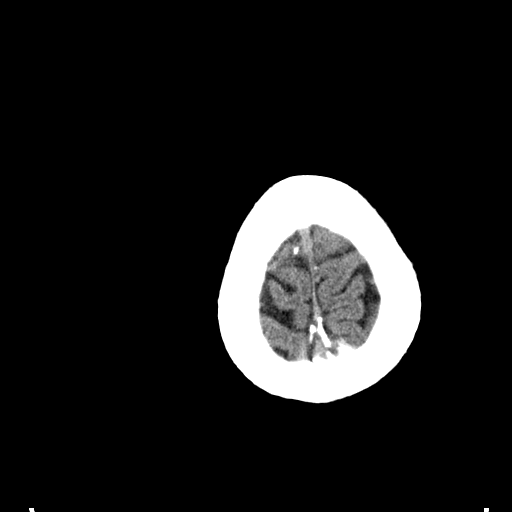
[im 30/32  brain]
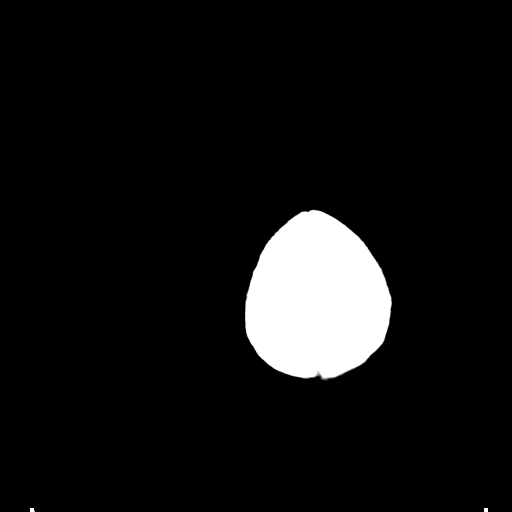

[16 of 30 positions shown; findings below may reference images not displayed]

FINDINGS: There is no evidence of acute infarction, mass lesion, or intra- or
extra-axial hemorrhage on CT.

The posterior fossa, including the cerebellum, brainstem and fourth
ventricle, is within normal limits. The third and lateral
ventricles, and basal ganglia are unremarkable in appearance. The
cerebral hemispheres are symmetric in appearance, with normal
gray-white differentiation. No mass effect or midline shift is seen.

There is no evidence of fracture; visualized osseous structures are
unremarkable in appearance. The visualized portions of the orbits
are within normal limits. The paranasal sinuses and mastoid air
cells are well-aerated. No significant soft tissue abnormalities are
seen.
IMPRESSION: Unremarkable noncontrast CT of the head.

## 2016-05-30 IMAGING — CT CT HEAD W/O CM
1 series · 15 of 30 positions shown, 19 images · non-contrast
Comparison: CT of the head performed 10/30/2015

CLINICAL DATA: Acute onset of left-sided weakness and slurred
speech. Code stroke. Initial encounter.

EXAM:
CT HEAD WITHOUT CONTRAST
TECHNIQUE: Contiguous axial images were obtained from the base of the skull
through the vertex without intravenous contrast.

[Series 2: head wo · axial · 0.46mm/px · z∈[+385,+520]mm · 15 of 31 slices shown, 19 images]
[im 2/31  brain]
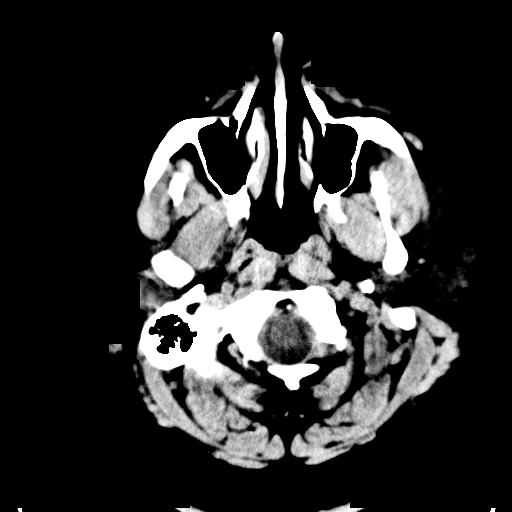
[im 2/31  bone]
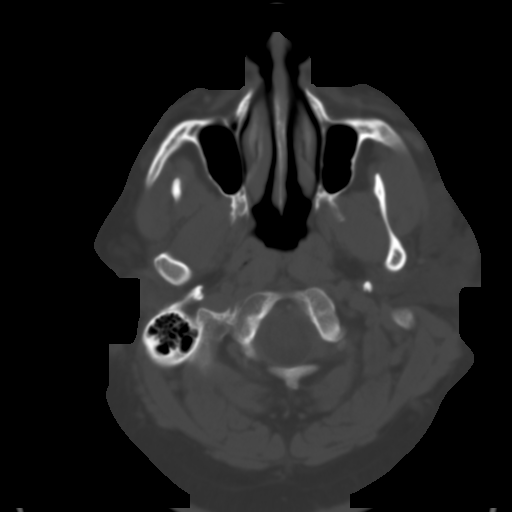
[im 4/31  brain]
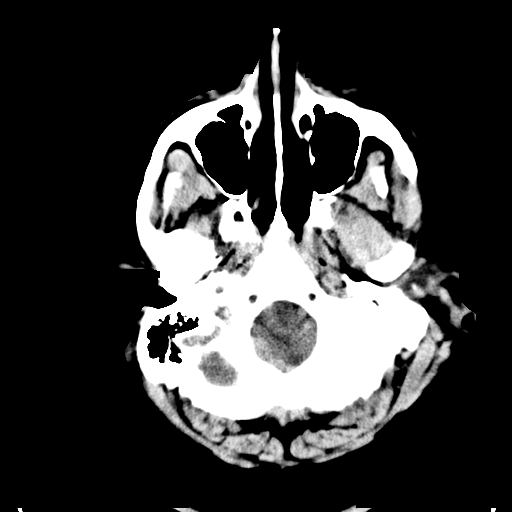
[im 6/31  brain]
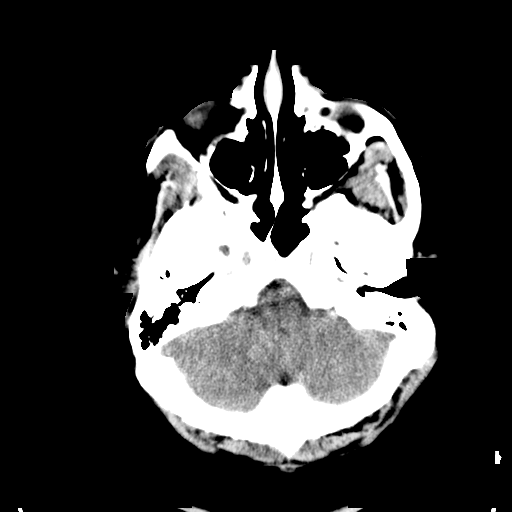
[im 8/31  brain]
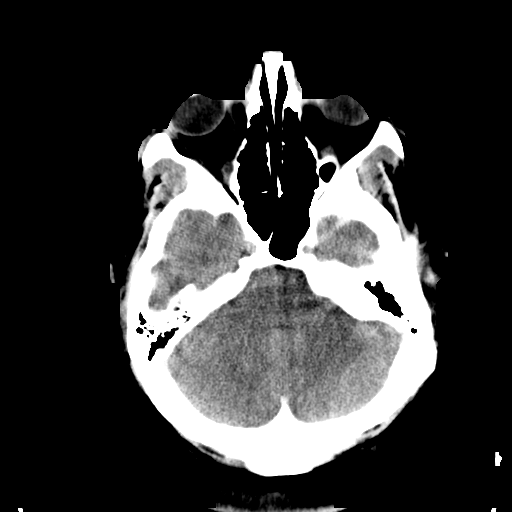
[im 10/31  brain]
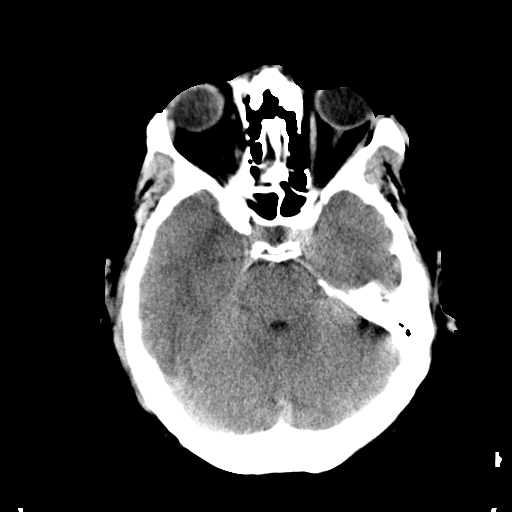
[im 10/31  bone]
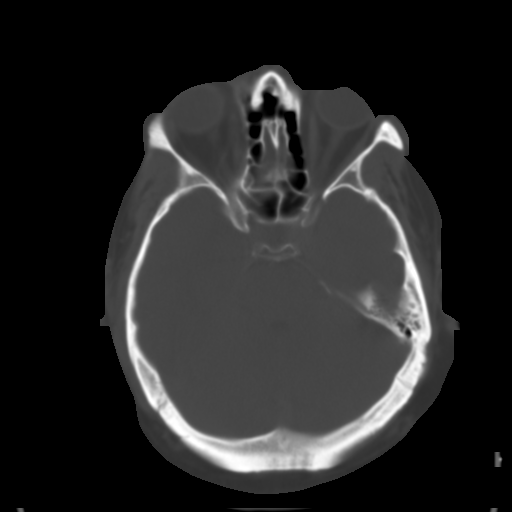
[im 12/31  brain]
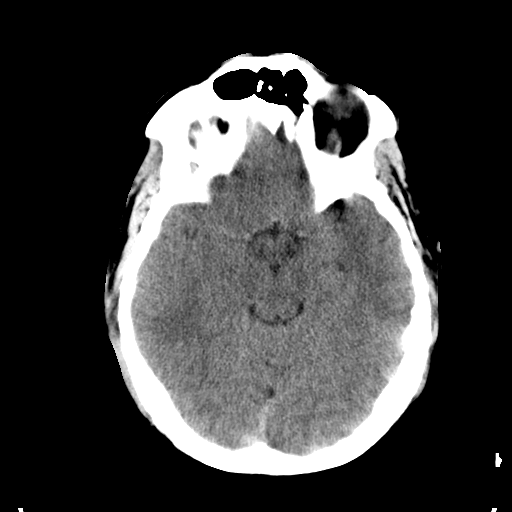
[im 14/31  brain]
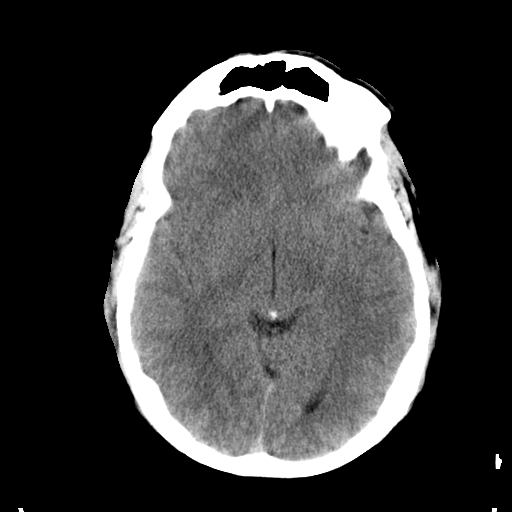
[im 16/31  brain]
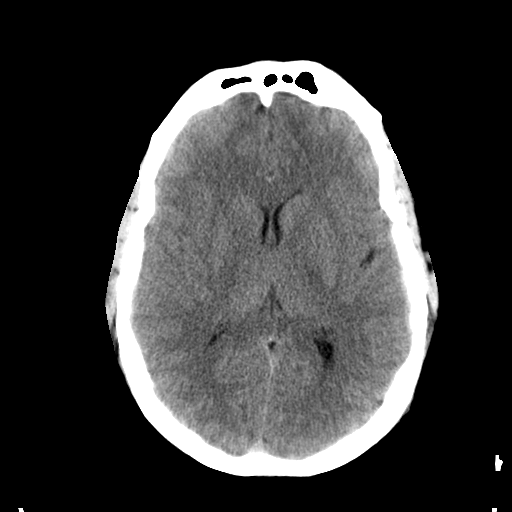
[im 17/31  brain]
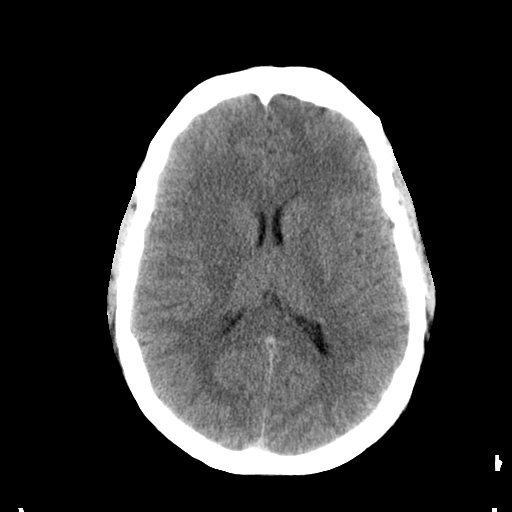
[im 17/31  bone]
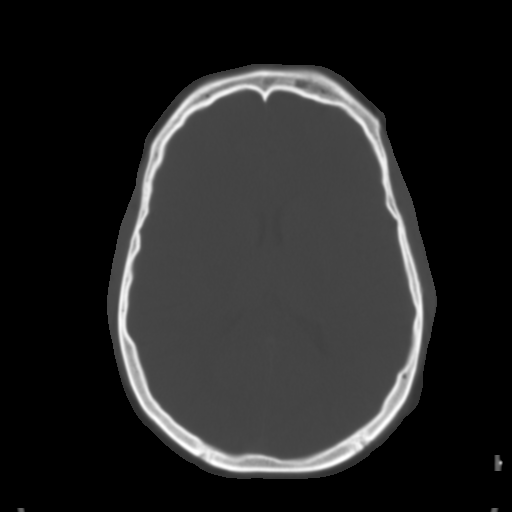
[im 19/31  brain]
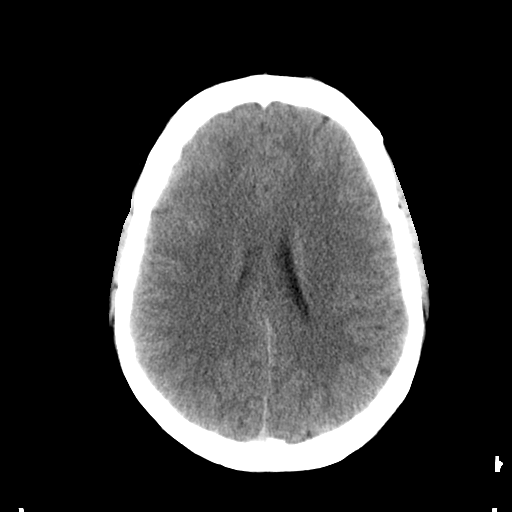
[im 21/31  brain]
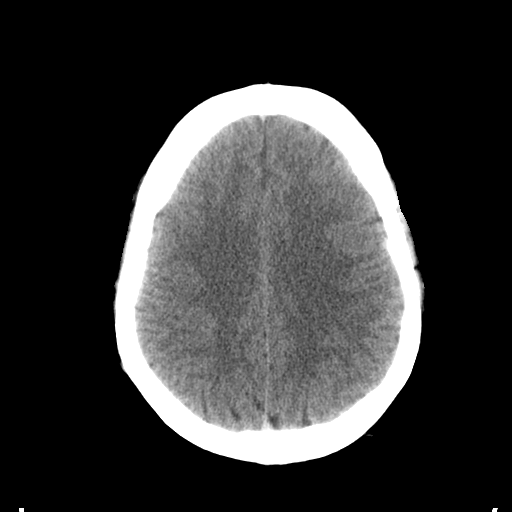
[im 23/31  brain]
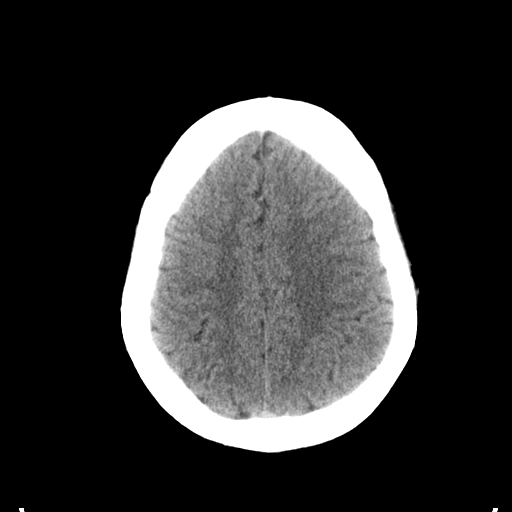
[im 25/31  brain]
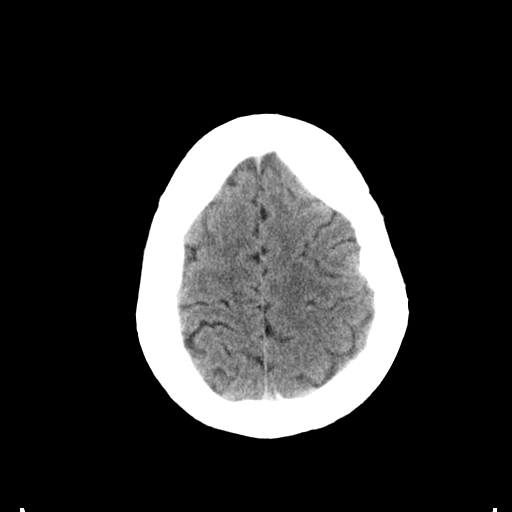
[im 25/31  bone]
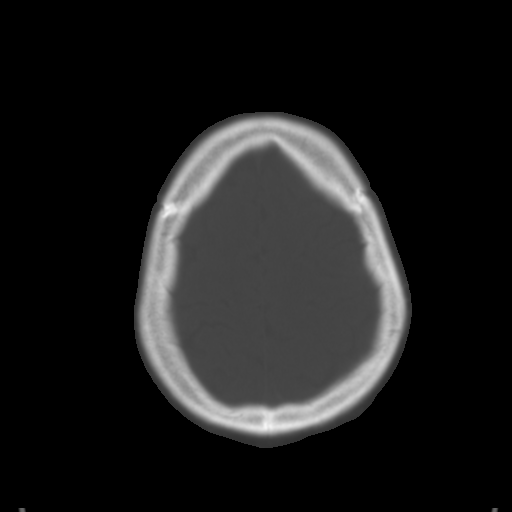
[im 27/31  brain]
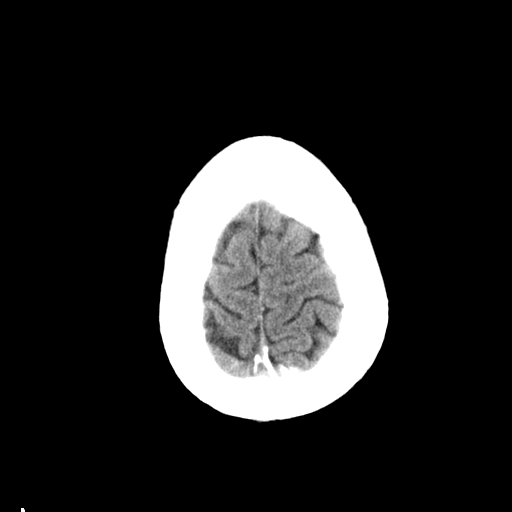
[im 29/31  brain]
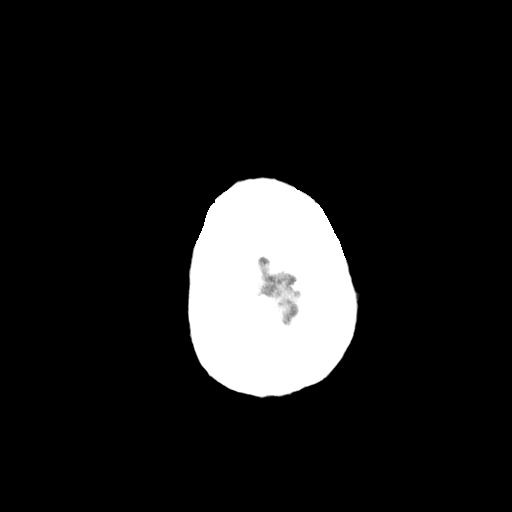

[15 of 30 positions shown; findings below may reference images not displayed]

FINDINGS: There is no evidence of acute infarction, mass lesion, or intra- or
extra-axial hemorrhage on CT.

The posterior fossa, including the cerebellum, brainstem and fourth
ventricle, is within normal limits. The third and lateral
ventricles, and basal ganglia are unremarkable in appearance. The
cerebral hemispheres are symmetric in appearance, with normal
gray-white differentiation. No mass effect or midline shift is seen.

There is no evidence of fracture; visualized osseous structures are
unremarkable in appearance. The visualized portions of the orbits
are within normal limits. The paranasal sinuses and mastoid air
cells are well-aerated. No significant soft tissue abnormalities are
seen.
IMPRESSION: Unremarkable noncontrast CT of the head.

These results were called by telephone at the time of interpretation
on 11/03/2015 at [DATE] to Dr. Tyge Taule, who verbally
acknowledged these results.

## 2016-10-16 ENCOUNTER — Encounter: Payer: Self-pay | Admitting: Emergency Medicine

## 2016-10-16 ENCOUNTER — Emergency Department
Admission: EM | Admit: 2016-10-16 | Discharge: 2016-10-16 | Disposition: A | Discharge status: Home / Self Care | Payer: Self-pay | Attending: Emergency Medicine | Admitting: Emergency Medicine

## 2016-10-16 DIAGNOSIS — L270 Generalized skin eruption due to drugs and medicaments taken internally: Secondary | ICD-10-CM | POA: Insufficient documentation

## 2016-10-16 DIAGNOSIS — L03116 Cellulitis of left lower limb: Secondary | ICD-10-CM | POA: Insufficient documentation

## 2016-10-16 DIAGNOSIS — E119 Type 2 diabetes mellitus without complications: Secondary | ICD-10-CM | POA: Insufficient documentation

## 2016-10-16 DIAGNOSIS — Z7984 Long term (current) use of oral hypoglycemic drugs: Secondary | ICD-10-CM | POA: Insufficient documentation

## 2016-10-16 DIAGNOSIS — R42 Dizziness and giddiness: Secondary | ICD-10-CM

## 2016-10-16 DIAGNOSIS — F17210 Nicotine dependence, cigarettes, uncomplicated: Secondary | ICD-10-CM | POA: Insufficient documentation

## 2016-10-16 HISTORY — DX: Necrotizing fasciitis: M72.6

## 2016-10-16 LAB — BASIC METABOLIC PANEL
Anion gap: 10 (ref 5–15)
BUN: 15 mg/dL (ref 6–20)
CHLORIDE: 102 mmol/L (ref 101–111)
CO2: 22 mmol/L (ref 22–32)
Calcium: 8.8 mg/dL — ABNORMAL LOW (ref 8.9–10.3)
Creatinine, Ser: 0.71 mg/dL (ref 0.61–1.24)
GFR calc non Af Amer: >60 mL/min (ref >60)
Glucose, Bld: 297 mg/dL — ABNORMAL HIGH (ref 65–99)
POTASSIUM: 3.8 mmol/L (ref 3.5–5.1)
SODIUM: 134 mmol/L — AB (ref 135–145)

## 2016-10-16 LAB — CBC
HEMATOCRIT: 45.2 % (ref 40.0–52.0)
HEMOGLOBIN: 15.8 g/dL (ref 13.0–18.0)
MCH: 29.3 pg (ref 26.0–34.0)
MCHC: 34.8 g/dL (ref 32.0–36.0)
MCV: 84.3 fL (ref 80.0–100.0)
Platelets: 254 10*3/uL (ref 150–440)
RBC: 5.37 MIL/uL (ref 4.40–5.90)
RDW: 13.3 % (ref 11.5–14.5)
WBC: 6.8 10*3/uL (ref 3.8–10.6)

## 2016-10-16 MED ORDER — SULFAMETHOXAZOLE-TRIMETHOPRIM 800-160 MG PO TABS
1.0000 | ORAL_TABLET | Freq: Two times a day (BID) | ORAL | 0 refills | Status: AC
Start: 2016-10-16 — End: ?

## 2016-10-16 MED ORDER — CEPHALEXIN 500 MG PO CAPS: 500.0000 mg | ORAL_CAPSULE | Freq: Three times a day (TID) | ORAL | 0 refills | Status: AC

## 2016-10-16 MED ORDER — SODIUM CHLORIDE 0.9 % IV BOLUS (SEPSIS)
1000.0000 mL | Freq: Once | INTRAVENOUS | Status: AC
  Administered 2016-10-16: 1000 mL via INTRAVENOUS

## 2016-10-16 NOTE — ED Notes (Signed)
Patient took of BP cuff. Refusing BP to be taken at this time.

## 2016-10-16 NOTE — ED Triage Notes (Signed)
Per [facility] patient comes from home after a syncopal episode. Patient unsure if he hit his head, GCS 15. Patients CBG per EMS 339. [facility]. He was admitted for uncontrolled blood sugar. Last summer patient had flesh eating bacteria to left buttock. Today patient has red rash noted to abdomen. Patient A&O x4. C/O LUQ abdominal pain.

## 2016-10-16 NOTE — ED Provider Notes (Signed)
[facility] Emergency Department Provider Note  ____________________________________________  Time seen: Approximately 12:34 PM  I have reviewed the triage vital signs and the nursing notes.   HISTORY  Chief Complaint Loss of Consciousness    HPI Jeffery Barnes is a 36 y.o. male who complains of dizziness for the past 4 days. [facility] yesterday when he was discharged home. He was put on oral clindamycin for soft tissue infection and his inner left thigh. He reports that it is better but still painful. He also noticed a red rash on his abdomen today. He denies any throat swelling or shortness of breath or vomiting. No other new complaints. The dizziness itself he reports is a recurrent chronic issue.     Past Medical History:  Diagnosis Date  . Diabetes mellitus without complication (HCC)   . Flesh-eating bacteria (HCC)   . GERD (gastroesophageal reflux disease)   . Mixed hyperlipidemia   . Stroke [facility](HCC)      Patient Active Problem List   Diagnosis Date Noted  . Type 2 diabetes mellitus (HCC) 11/04/2015  . GERD (gastroesophageal reflux disease) 11/04/2015  . Mixed hyperlipidemia 11/04/2015  . TIA (transient ischemic attack) 10/31/2015  . Chest pain, central 10/30/2015     Past Surgical History:  Procedure Laterality Date  . ABCESS DRAINAGE    . NO PAST SURGERIES       Prior to Admission medications   Medication Sig Start Date End Date Taking? Authorizing Provider  glipiZIDE (GLUCOTROL) 5 MG tablet Take 1 tablet (5 mg total) by mouth daily before breakfast. 10/31/15  Yes Enedina FinnerSona Patel, MD  metFORMIN (GLUCOPHAGE) 500 MG tablet Take 500 mg by mouth 2 (two) times daily with a meal.   Yes Historical Provider, MD  omeprazole (PRILOSEC) 40 MG capsule Take 40 mg by mouth at bedtime.   Yes Historical Provider, MD  atorvastatin (LIPITOR) 40 MG tablet Take 1 tablet (40 mg total) by mouth daily at 6 PM. 10/31/15   Enedina FinnerSona  Patel, MD  cephALEXin (KEFLEX) 500 MG capsule Take 1 capsule (500 mg total) by mouth 3 (three) times daily. 10/16/16   Sharman CheekPhillip Tarisa Paola, MD  fenofibrate 160 MG tablet Take 1 tablet (160 mg total) by mouth daily. 10/31/15   Enedina FinnerSona Patel, MD  sulfamethoxazole-trimethoprim (BACTRIM DS) 800-160 MG tablet Take 1 tablet by mouth 2 (two) times daily. 10/16/16   Sharman CheekPhillip Haitham Dolinsky, MD     Allergies Patient has no known allergies.   Family History  Problem Relation Age of Onset  . Diabetes Mother     Social History Social History  Substance Use Topics  . Smoking status: Current Every Day Smoker    Packs/day: 2.00    Types: Cigarettes  . Smokeless tobacco: Never Used  . Alcohol use No    Review of Systems  Constitutional:   No fever or chills.  ENT:   No sore throat. No rhinorrhea. Cardiovascular:   No chest pain. Respiratory:   No dyspnea or cough. Gastrointestinal:   Negative for abdominal pain, vomiting and diarrhea.  Genitourinary:   Negative for dysuria or difficulty urinating. Musculoskeletal:   Left thigh pain Neurological:   Negative for headaches 10-point ROS otherwise negative.  ____________________________________________   PHYSICAL EXAM:  VITAL SIGNS: ED Triage Vitals  Enc Vitals Group     BP 10/16/16 0925 139/74     Pulse Rate 10/16/16 0925 98     Resp 10/16/16 0930 19  Temp 10/16/16 0925 98.1 F (36.7 C)     Temp Source 10/16/16 0925 Oral     SpO2 10/16/16 0925 95 %     Weight 10/16/16 0925 (!) 340 lb (154.2 kg)     Height 10/16/16 0925 6' (1.829 m)     Head Circumference --      Peak Flow --      Pain Score 10/16/16 0925 9     Pain Loc --      Pain Edu? --      Excl. in GC? --     Vital signs reviewed, nursing assessments reviewed.   Constitutional:   Alert and oriented. Well appearing and in no distress. Eyes:   No scleral icterus. No conjunctival pallor. PERRL. EOMI.  No nystagmus. ENT   Head:   Normocephalic and atraumatic.   Nose:   No  congestion/rhinnorhea. No septal hematoma   Mouth/Throat:   MMM, no pharyngeal erythema. No peritonsillar mass.    Neck:   No stridor. No SubQ emphysema. No meningismus. Hematological/Lymphatic/Immunilogical:   No cervical lymphadenopathy. Cardiovascular:   RRR. Symmetric bilateral radial and DP pulses.  No murmurs.  Respiratory:   Normal respiratory effort without tachypnea nor retractions. Breath sounds are clear and equal bilaterally. No wheezes/rales/rhonchi. Gastrointestinal:   Soft and nontender. Non distended. There is no CVA tenderness.  No rebound, rigidity, or guarding. Genitourinary:   deferred Musculoskeletal:   Nontender with normal range of motion in all extremities. No joint effusions.  No lower extremity tenderness.  No edema. Neurologic:   Normal speech and language.  CN 2-10 normal. Motor grossly intact. No gross focal neurologic deficits are appreciated.  Skin:    Skin is warm, dry and intact. There is a 2 x 3 cm area in the left inner thigh with subcutaneous scar tissue consistent with healing soft tissue infection. No inflammatory changes. Mildly tender to touch. No fluctuance. No streaking or erythema. There is a diffuse erythematous rash on the trunk and upper and lower extremities. Not obviously urticarial.  No petechiae, purpura, or bullae. Not consistent with vasculitis.  ____________________________________________    LABS (pertinent positives/negatives) (all labs ordered are listed, but only abnormal results are displayed) Labs Reviewed  BASIC METABOLIC PANEL - Abnormal; Notable for the following:       Result Value   Sodium 134 (*)    Glucose, Bld 297 (*)    Calcium 8.8 (*)    All other components within normal limits  CBC   ____________________________________________   EKG  Interpreted by me Normal sinus rhythm rate of 100, normal axis and intervals. Normal QRS and ST segments. No acute ischemic changes. Unchanged from  10/30/2015.  ____________________________________________    RADIOLOGY    ____________________________________________   PROCEDURES Procedures  ____________________________________________   INITIAL IMPRESSION / ASSESSMENT AND PLAN / ED COURSE  Pertinent labs & imaging results that were available during my care of the patient were reviewed by me and considered in my medical decision making (see chart for details).  Patient well appearing no acute distress. Presents with dizziness and for reevaluation of his soft tissue infection for which is been taking clindamycin for a couple of days. Also noted to have a rash which may be a reaction to the clindamycin either allergic or inflammatory. Workup negative, patient feeling better after IV fluids, tolerating oral intake. Soft tissue infection is improved with the clinical ice in. I'll switch him to Bactrim and Keflex, follow up with primary care. Return percussion given.Considering  the patient's symptoms, medical history, and physical examination today, I have low suspicion for ischemic stroke, intracranial hemorrhage, meningitis, encephalitis, carotid or vertebral dissection, venous sinus thrombosis, MS, intracranial hypertension, glaucoma, CRAO, CRVO, or temporal arteritis.      Clinical Course    ____________________________________________   FINAL CLINICAL IMPRESSION(S) / ED DIAGNOSES  Final diagnoses:  Dizziness  Cellulitis of left lower extremity  Drug rash      New Prescriptions   CEPHALEXIN (KEFLEX) 500 MG CAPSULE    Take 1 capsule (500 mg total) by mouth 3 (three) times daily.   SULFAMETHOXAZOLE-TRIMETHOPRIM (BACTRIM DS) 800-160 MG TABLET    Take 1 tablet by mouth 2 (two) times daily.     Portions of this note were generated with dragon dictation software. Dictation errors may occur despite best attempts at proofreading.    Sharman Cheek, MD 10/16/16 1539

## 2016-10-16 NOTE — ED Notes (Signed)
Patient refusing discharge vital signs to be taken.

## 2016-10-16 NOTE — Discharge Instructions (Signed)
Discontinue clindamycin. Start taking Bactrim and Keflex to treat your skin infection.

## 2016-11-27 ENCOUNTER — Emergency Department: Payer: No Typology Code available for payment source

## 2016-11-27 ENCOUNTER — Emergency Department
Admission: EM | Admit: 2016-11-27 | Discharge: 2016-11-27 | Disposition: A | Discharge status: Home / Self Care | Payer: No Typology Code available for payment source | Attending: Emergency Medicine | Admitting: Emergency Medicine

## 2016-11-27 ENCOUNTER — Encounter: Payer: Self-pay | Admitting: *Deleted

## 2016-11-27 DIAGNOSIS — Z79899 Other long term (current) drug therapy: Secondary | ICD-10-CM | POA: Diagnosis not present

## 2016-11-27 DIAGNOSIS — Y92411 Interstate highway as the place of occurrence of the external cause: Secondary | ICD-10-CM | POA: Insufficient documentation

## 2016-11-27 DIAGNOSIS — E119 Type 2 diabetes mellitus without complications: Secondary | ICD-10-CM | POA: Insufficient documentation

## 2016-11-27 DIAGNOSIS — S20211A Contusion of right front wall of thorax, initial encounter: Secondary | ICD-10-CM

## 2016-11-27 DIAGNOSIS — F17210 Nicotine dependence, cigarettes, uncomplicated: Secondary | ICD-10-CM | POA: Insufficient documentation

## 2016-11-27 DIAGNOSIS — Y9389 Activity, other specified: Secondary | ICD-10-CM | POA: Diagnosis not present

## 2016-11-27 DIAGNOSIS — R1011 Right upper quadrant pain: Secondary | ICD-10-CM | POA: Diagnosis not present

## 2016-11-27 DIAGNOSIS — S299XXA Unspecified injury of thorax, initial encounter: Secondary | ICD-10-CM | POA: Diagnosis present

## 2016-11-27 DIAGNOSIS — Z7984 Long term (current) use of oral hypoglycemic drugs: Secondary | ICD-10-CM | POA: Insufficient documentation

## 2016-11-27 DIAGNOSIS — Y999 Unspecified external cause status: Secondary | ICD-10-CM | POA: Insufficient documentation

## 2016-11-27 LAB — COMPREHENSIVE METABOLIC PANEL
ALT: 53 U/L (ref 17–63)
ANION GAP: 12 (ref 5–15)
AST: 53 U/L — ABNORMAL HIGH (ref 15–41)
Albumin: 4.2 g/dL (ref 3.5–5.0)
Alkaline Phosphatase: 91 U/L (ref 38–126)
BILIRUBIN TOTAL: 0.6 mg/dL (ref 0.3–1.2)
BUN: 12 mg/dL (ref 6–20)
CO2: 19 mmol/L — ABNORMAL LOW (ref 22–32)
Calcium: 9.3 mg/dL (ref 8.9–10.3)
Chloride: 105 mmol/L (ref 101–111)
Creatinine, Ser: 0.61 mg/dL (ref 0.61–1.24)
GFR calc Af Amer: >60 mL/min (ref >60)
GFR calc non Af Amer: >60 mL/min (ref >60)
Glucose, Bld: 152 mg/dL — ABNORMAL HIGH (ref 65–99)
POTASSIUM: 3.7 mmol/L (ref 3.5–5.1)
Sodium: 136 mmol/L (ref 135–145)
TOTAL PROTEIN: 7.1 g/dL (ref 6.5–8.1)

## 2016-11-27 LAB — CBC WITH DIFFERENTIAL/PLATELET
Basophils Absolute: 0.1 10*3/uL (ref 0–0.1)
Basophils Relative: 1 %
EOS ABS: 0.3 10*3/uL (ref 0–0.7)
EOS PCT: 3 %
HCT: 44 % (ref 40.0–52.0)
Hemoglobin: 14.9 g/dL (ref 13.0–18.0)
LYMPHS ABS: 3.1 10*3/uL (ref 1.0–3.6)
Lymphocytes Relative: 26 %
MCH: 29.4 pg (ref 26.0–34.0)
MCHC: 34 g/dL (ref 32.0–36.0)
MCV: 86.4 fL (ref 80.0–100.0)
Monocytes Absolute: 0.6 10*3/uL (ref 0.2–1.0)
Monocytes Relative: 5 %
Neutro Abs: 7.6 10*3/uL — ABNORMAL HIGH (ref 1.4–6.5)
Neutrophils Relative %: 65 %
PLATELETS: 256 10*3/uL (ref 150–440)
RBC: 5.09 MIL/uL (ref 4.40–5.90)
RDW: 13.4 % (ref 11.5–14.5)
WBC: 11.7 10*3/uL — AB (ref 3.8–10.6)

## 2016-11-27 LAB — URINALYSIS, COMPLETE (UACMP) WITH MICROSCOPIC
BILIRUBIN URINE: NEGATIVE
Glucose, UA: NEGATIVE mg/dL
HGB URINE DIPSTICK: NEGATIVE
Ketones, ur: NEGATIVE mg/dL
LEUKOCYTES UA: NEGATIVE
NITRITE: NEGATIVE
Protein, ur: NEGATIVE mg/dL
RBC / HPF: NONE SEEN RBC/hpf (ref 0–5)
SPECIFIC GRAVITY, URINE: 1.029 (ref 1.005–1.030)
Squamous Epithelial / LPF: NONE SEEN
pH: 6 (ref 5.0–8.0)

## 2016-11-27 LAB — LIPASE, BLOOD: Lipase: 17 U/L (ref 11–51)

## 2016-11-27 MED ORDER — LIDOCAINE 5 % EX PTCH: 1.0000 | MEDICATED_PATCH | CUTANEOUS | 0 refills | Status: AC

## 2016-11-27 MED ORDER — KETOROLAC TROMETHAMINE 30 MG/ML IJ SOLN
INTRAMUSCULAR | Status: AC
  Administered 2016-11-27: 30 mg via INTRAVENOUS
  Filled 2016-11-27: qty 1

## 2016-11-27 MED ORDER — IOPAMIDOL (ISOVUE-300) INJECTION 61%
100.0000 mL | Freq: Once | INTRAVENOUS | Status: AC | PRN
  Administered 2016-11-27: 100 mL via INTRAVENOUS

## 2016-11-27 MED ORDER — IBUPROFEN 800 MG PO TABS: 800.0000 mg | ORAL_TABLET | Freq: Three times a day (TID) | ORAL | 0 refills | Status: AC | PRN

## 2016-11-27 MED ORDER — KETOROLAC TROMETHAMINE 30 MG/ML IJ SOLN
30.0000 mg | Freq: Once | INTRAMUSCULAR | Status: AC
  Administered 2016-11-27: 30 mg via INTRAVENOUS

## 2016-11-27 MED ORDER — LIDOCAINE 5 % EX PTCH
1.0000 | MEDICATED_PATCH | CUTANEOUS | Status: DC
  Administered 2016-11-27: 1 via TRANSDERMAL
  Filled 2016-11-27: qty 1

## 2016-11-27 MED ORDER — HYDROMORPHONE HCL 1 MG/ML IJ SOLN
1.0000 mg | Freq: Once | INTRAMUSCULAR | Status: AC
  Administered 2016-11-27: 1 mg via INTRAVENOUS
  Filled 2016-11-27: qty 1

## 2016-11-27 MED ORDER — ONDANSETRON HCL 4 MG/2ML IJ SOLN
4.0000 mg | Freq: Once | INTRAMUSCULAR | Status: AC
  Administered 2016-11-27: 4 mg via INTRAVENOUS
  Filled 2016-11-27: qty 2
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
## 2016-11-27 NOTE — ED Notes (Signed)
Pt reports tingling in right leg and foot.  Pt states toes are tingling in right foot.   md aware.
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
## 2016-11-27 NOTE — Discharge Instructions (Signed)
Please return immediately if condition worsens. Please contact her primary physician or the physician you were given for referral. If you have any specialist physicians involved in her treatment and plan please also contact them. Thank you for using [facility]. ° °

## 2016-11-27 NOTE — ED Notes (Signed)
C/O pain 10/10.  Skin warm and dry. Respirations regular and non labored.  NAD.  Dr. Huel CoteQuigley notified.  Lidocaine patch ordered and applied.

## 2016-11-27 NOTE — ED Notes (Signed)
Report off to American Electric PowerJane RN.  Pt moved to room 2.

## 2016-11-28 LAB — CBC WITH DIFFERENTIAL/PLATELET: WBC: UNDETERMINED 10*3/uL (ref 3.8–10.6)

## 2019-02-10 ENCOUNTER — Other Ambulatory Visit: Payer: Self-pay

## 2019-02-10 ENCOUNTER — Encounter: Payer: Self-pay | Admitting: Emergency Medicine

## 2019-02-10 ENCOUNTER — Emergency Department
Admission: EM | Admit: 2019-02-10 | Discharge: 2019-02-10 | Disposition: A | Discharge status: Home / Self Care | Payer: Self-pay | Attending: Emergency Medicine | Admitting: Emergency Medicine

## 2019-02-10 DIAGNOSIS — Z5321 Procedure and treatment not carried out due to patient leaving prior to being seen by health care provider: Secondary | ICD-10-CM | POA: Insufficient documentation

## 2019-02-10 DIAGNOSIS — R739 Hyperglycemia, unspecified: Secondary | ICD-10-CM | POA: Insufficient documentation

## 2019-02-10 NOTE — ED Triage Notes (Addendum)
Pt presents to ED via POV with c/o intermittent blurry vision and "I'm aggravated". Pt states today checked his sugar on a family member's glucometer and his CBG was 500. Pt is alert and oriented, NAD noted at this time. Pt states has been 2 yrs since being on PO medication for DM.

## 2019-02-10 NOTE — ED Notes (Addendum)
After triage this RN explained to patient and his niece the visitor policy. Pt states he is unwilling to go back for protocols without his niece. This RN discussed with Tammy Sours, Consulting civil engineer. Per Charge RN no exception to be made at this time. Explained to patient who walked out of ED with steady gait. NAD noted at this time. Unable to obtain VS or bloodwork at this time.

## 2023-08-21 ENCOUNTER — Encounter: Payer: Self-pay | Admitting: Emergency Medicine
